# Patient Record
Sex: Female | Born: 1935 | Race: White | Hispanic: No | Marital: Married | Smoking: Former smoker
Health system: Southern US, Community
[De-identification: ages and names within clinical notes are randomized; demographics above are authoritative.]

## PROBLEM LIST (undated history)

## (undated) DIAGNOSIS — I4891 Unspecified atrial fibrillation: Principal | ICD-10-CM

## (undated) DIAGNOSIS — Z8673 Personal history of transient ischemic attack (TIA), and cerebral infarction without residual deficits: Secondary | ICD-10-CM

## (undated) DIAGNOSIS — Q893 Situs inversus: Secondary | ICD-10-CM

## (undated) DIAGNOSIS — I639 Cerebral infarction, unspecified: Secondary | ICD-10-CM

## (undated) DIAGNOSIS — M810 Age-related osteoporosis without current pathological fracture: Secondary | ICD-10-CM

## (undated) DIAGNOSIS — H3530 Unspecified macular degeneration: Secondary | ICD-10-CM

## (undated) DIAGNOSIS — I10 Essential (primary) hypertension: Secondary | ICD-10-CM

## (undated) DIAGNOSIS — R739 Hyperglycemia, unspecified: Secondary | ICD-10-CM

## (undated) DIAGNOSIS — M1990 Unspecified osteoarthritis, unspecified site: Secondary | ICD-10-CM

## (undated) DIAGNOSIS — E559 Vitamin D deficiency, unspecified: Secondary | ICD-10-CM

## (undated) DIAGNOSIS — E785 Hyperlipidemia, unspecified: Secondary | ICD-10-CM

## (undated) DIAGNOSIS — E7800 Pure hypercholesterolemia, unspecified: Secondary | ICD-10-CM

## (undated) HISTORY — DX: Hyperlipidemia, unspecified: E78.5

## (undated) HISTORY — DX: Hyperglycemia, unspecified: R73.9

## (undated) HISTORY — DX: Pure hypercholesterolemia, unspecified: E78.00

## (undated) HISTORY — DX: Unspecified atrial fibrillation: I48.91

## (undated) HISTORY — DX: Essential (primary) hypertension: I10

## (undated) HISTORY — PX: TOTAL ABDOMINAL HYSTERECTOMY: SHX209

## (undated) HISTORY — PX: CATARACT EXTRACTION: SUR2

## (undated) HISTORY — DX: Vitamin D deficiency, unspecified: E55.9

## (undated) HISTORY — PX: CHOLECYSTECTOMY: SHX55

## (undated) HISTORY — PX: BUNIONECTOMY: SHX129

## (undated) HISTORY — DX: Unspecified osteoarthritis, unspecified site: M19.90

## (undated) HISTORY — DX: Unspecified macular degeneration: H35.30

## (undated) HISTORY — DX: Situs inversus: Q89.3

## (undated) HISTORY — DX: Personal history of transient ischemic attack (TIA), and cerebral infarction without residual deficits: Z86.73

## (undated) HISTORY — DX: Cerebral infarction, unspecified: I63.9

## (undated) HISTORY — PX: BILATERAL OOPHORECTOMY: SHX1221

## (undated) HISTORY — DX: Age-related osteoporosis without current pathological fracture: M81.0

---

## 1957-06-14 HISTORY — PX: BREAST EXCISIONAL BIOPSY: SUR124

## 2000-06-24 ENCOUNTER — Encounter: Admission: RE | Admit: 2000-06-24 | Discharge: 2000-06-24 | Payer: Self-pay | Admitting: Geriatric Medicine

## 2000-06-24 ENCOUNTER — Encounter: Payer: Self-pay | Admitting: Geriatric Medicine

## 2000-08-17 ENCOUNTER — Ambulatory Visit (HOSPITAL_COMMUNITY): Admission: RE | Admit: 2000-08-17 | Discharge: 2000-08-17 | Payer: Self-pay | Admitting: Gastroenterology

## 2000-08-17 ENCOUNTER — Encounter: Payer: Self-pay | Admitting: Gastroenterology

## 2001-06-26 ENCOUNTER — Encounter: Payer: Self-pay | Admitting: Geriatric Medicine

## 2001-06-26 ENCOUNTER — Encounter: Admission: RE | Admit: 2001-06-26 | Discharge: 2001-06-26 | Payer: Self-pay | Admitting: Geriatric Medicine

## 2002-08-01 ENCOUNTER — Encounter: Payer: Self-pay | Admitting: Geriatric Medicine

## 2002-08-01 ENCOUNTER — Encounter: Admission: RE | Admit: 2002-08-01 | Discharge: 2002-08-01 | Payer: Self-pay | Admitting: Geriatric Medicine

## 2003-08-06 ENCOUNTER — Encounter: Admission: RE | Admit: 2003-08-06 | Discharge: 2003-08-06 | Payer: Self-pay | Admitting: Geriatric Medicine

## 2004-08-06 ENCOUNTER — Encounter: Admission: RE | Admit: 2004-08-06 | Discharge: 2004-08-06 | Payer: Self-pay | Admitting: Geriatric Medicine

## 2004-09-12 ENCOUNTER — Inpatient Hospital Stay (HOSPITAL_COMMUNITY): Admission: EM | Admit: 2004-09-12 | Discharge: 2004-09-15 | Payer: Self-pay | Admitting: Emergency Medicine

## 2004-09-14 ENCOUNTER — Encounter (INDEPENDENT_AMBULATORY_CARE_PROVIDER_SITE_OTHER): Payer: Self-pay | Admitting: Cardiology

## 2005-08-19 ENCOUNTER — Encounter: Admission: RE | Admit: 2005-08-19 | Discharge: 2005-08-19 | Payer: Self-pay | Admitting: Geriatric Medicine

## 2005-09-07 ENCOUNTER — Encounter: Admission: RE | Admit: 2005-09-07 | Discharge: 2005-09-07 | Payer: Self-pay | Admitting: Geriatric Medicine

## 2005-09-10 ENCOUNTER — Encounter: Admission: RE | Admit: 2005-09-10 | Discharge: 2005-09-10 | Payer: Self-pay | Admitting: Geriatric Medicine

## 2006-03-10 ENCOUNTER — Encounter: Admission: RE | Admit: 2006-03-10 | Discharge: 2006-03-10 | Payer: Self-pay | Admitting: Geriatric Medicine

## 2006-08-24 ENCOUNTER — Encounter: Admission: RE | Admit: 2006-08-24 | Discharge: 2006-08-24 | Payer: Self-pay | Admitting: Geriatric Medicine

## 2007-02-14 ENCOUNTER — Encounter: Admission: RE | Admit: 2007-02-14 | Discharge: 2007-02-14 | Payer: Self-pay | Admitting: Geriatric Medicine

## 2007-08-25 ENCOUNTER — Encounter: Admission: RE | Admit: 2007-08-25 | Discharge: 2007-08-25 | Payer: Self-pay | Admitting: Geriatric Medicine

## 2008-08-28 ENCOUNTER — Encounter: Admission: RE | Admit: 2008-08-28 | Discharge: 2008-08-28 | Payer: Self-pay | Admitting: Geriatric Medicine

## 2009-08-29 ENCOUNTER — Encounter: Admission: RE | Admit: 2009-08-29 | Discharge: 2009-08-29 | Payer: Self-pay | Admitting: Geriatric Medicine

## 2010-07-05 ENCOUNTER — Encounter: Payer: Self-pay | Admitting: Geriatric Medicine

## 2010-07-27 ENCOUNTER — Other Ambulatory Visit: Payer: Self-pay | Admitting: Geriatric Medicine

## 2010-07-27 DIAGNOSIS — Z1231 Encounter for screening mammogram for malignant neoplasm of breast: Secondary | ICD-10-CM

## 2010-08-31 ENCOUNTER — Ambulatory Visit
Admission: RE | Admit: 2010-08-31 | Discharge: 2010-08-31 | Disposition: A | Discharge status: Home / Self Care | Payer: Self-pay | Source: Ambulatory Visit | Attending: Geriatric Medicine | Admitting: Geriatric Medicine

## 2010-08-31 DIAGNOSIS — Z1231 Encounter for screening mammogram for malignant neoplasm of breast: Secondary | ICD-10-CM

## 2010-10-30 NOTE — H&P (Signed)
Jill Harvey, Jill Harvey            ACCOUNT NO.:  [id number]   MEDICAL RECORD NO.:  [id number]          PATIENT TYPE:  INP   LOCATION:  1826                         FACILITY:  [facility]   PHYSICIAN:  Deanna Artis. Hickling, M.D.DATE OF BIRTH:  May 02, 1926   DATE OF ADMISSION:  09/12/2004  DATE OF DISCHARGE:                                HISTORY & PHYSICAL   CHIEF COMPLAINT:  Code stroke.   HISTORY OF PRESENT ILLNESS:  An 77 year old right-handed Caucasian woman  working in the yard with her husband.  About 1 p.m., she began to have  difficulty tying ribbons, was clumsy with planting, started to stumble, and  had slurred speech.  She became somnolent and poorly responsive.  Her  husband called EMS.  They arrived at [facility] at 1501.  Assessed by Dr. Radford Pax,  who called on me at 1535.  CT at 1540, interpreted stat by me.  No acute  infarction, mild atrophy.  The patient improved in the hospital, but when  left alone is stuporous.   PAST MEDICAL HISTORY:  1.  Dyslipidemia.  2.  Hypertension.  3.  Postmenopausal.  4.  Allergic rhinitis.  5.  Macular degeneration.   REVIEW OF SYSTEMS:  Otherwise negative for 12-system review.   PAST SURGICAL HISTORY:  1.  Hysterectomy.  2.  Sinus operation.  3.  Cholecystectomy.  4.  Right iridectomy.   MEDICATIONS:  1.  Atenolol.  2.  Lipitor.  3.  Lasix.  4.  Premarin.  5.  Zyrtec.   ALLERGIES:  None.   FAMILY HISTORY:  Mother with heart disease, died at age 3.  Father died in  mid-60s with diabetes mellitus.  Sisters, age 12 and 69, alive and well.   SOCIAL HISTORY:  The patient is a retired Engineer, civil (consulting), and is married.  No alcohol  or smoking history.  Fully independent.  Rankin score 0.   PHYSICAL EXAMINATION:  VITAL SIGNS:  Blood pressure 191/88, resting pulse  60, respirations 20, temperature 97.9, oxygen saturation 100%.  HEENT:  No bruits, no infection, no meningismus.  LUNGS:  Clear.  HEART:  Regular sinus rhythm.  No murmurs.  Pulses  normal.  ABDOMEN:  Soft.  Bowel sounds normal.  No hepatosplenomegaly.  EXTREMITIES:  Normal.  NEUROLOGIC:  The patient is sleepy.  Has dysarthria.  No dysphagia.  Oriented.  NIH stroke scale score is 3.  Cranial nerves - round and reactive  pupils.  Right iridectomy.  Fundi with possible macular degeneration in the  right eye.  Visual fields full.  Mild left central 7th.  Decreased sensation  on the left side.  Her hearing is okay.  Visual acuity is poor.  Motor  examination revealed no drift.  Fine motor movements were good.  Strength is  normal.  Sensation - left hypesthesia.  Does not respect the midline.  Cerebellar examination - finger-to-nose, no dystaxia, dysmetria.  Gait falls  to the right when she walks.  Deep tendon reflexes were diminished.  The  patient had bilateral flexor plantar responses.   IMPRESSION:  Left central 7th left hemihypesthesia, right hemidystaxia with  gait disorder.  This may represent a high right pontine or mesencephalic  lesion.   PLAN:  IV fluids, aspirin, oxygen, MRI, MRA today so I can determine the  extent of this.  I am supposed to leave her off of heparin unless she  progresses, in which case I would start it.  I am worried about the  possibility of a basilar artery thrombosis.   I appreciate the opportunity to participate in her care.  I have discussed  this thoroughly with the patient and her husband.  She was not a candidate  for TPA because she was beyond the 3 hour window, and, in my opinion, this  represented a small vessel stroke, and therefore catheter angiography was  not indicated.      [facility]/MEDQ  D:  09/12/2004  T:  09/12/2004  Job:  [id number]

## 2010-10-30 NOTE — Discharge Summary (Signed)
Jill Harvey, CHAPLIN            ACCOUNT NO.:  [id number]   MEDICAL RECORD NO.:  [id number]          PATIENT TYPE:  INP   LOCATION:  3027                         FACILITY:  [facility]   PHYSICIAN:  Zetha Kuhar Dictator       DATE OF BIRTH:  17-Jun-1925   DATE OF ADMISSION:  09/12/2004  DATE OF DISCHARGE:  09/15/2004                                 DISCHARGE SUMMARY   DIAGNOSES AT THE TIME OF DISCHARGE:  1.  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  2.  Right internal carotid artery stenosis.  3.  Hypertension.  4.  Hypercholesterolemia.  5.  Postmenopausal.  6.  Allergic rhinitis.  7.  Macular degeneration.  8.  Status post hysterectomy.  9.  Status post cholecystectomy.  10. Status post oophorectomy.  11. Status post sinus operation.   MEDICINES AT THE TIME OF DISCHARGE:  1.  Aggrenox one p.o. daily x14 days and increase to b.i.d.  2.  Lasix 20 mg a day.  3.  Lipitor 10 mg a day.  4.  Vasotec 2.5 mg a day.  5.  Zyrtec 10 mg a day.  6.  Atenolol 50 mg a day.  7.  Stop Premarin.  8.  Stop aspirin.   STUDIES PERFORMED:  1.  CT of the brain on admission shows no acute intracranial findings and      mucus retention cyst or polyp in right maxillary sinus.  2.  MRI of the brain initially showed no acute abnormality with an apparent      occlusion in the left internal carotid artery on the MRA possibly due to      artifact as it is patent on the contrast-enhanced MRA.  There is severe      stenosis of the left PCA.  There is also disease in the right ACA and      right MCA.  3.  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  There is a 60-80% web-like stenosis in the mid cervical      right internal carotid artery. The left internal carotid artery appears      patent with only mild narrowing.  4.   Repeat MRI showed marked interval change with development of a large      right thalamic infarction and a tiny left thalamic infarction.  5.  2-D echocardiogram shows an ejection fraction of 55-65% with no embolic      source.  6.  Transcranial Doppler is pending.  7.  Carotid Doppler is pending.  8.  EKG shows suspect lead reversal interpretation, same side reversal,      unusual P axis, possible atropic atrial rhythm with occasional      supraventricular complexes, left posterior fascicular block, cannot rule      out inferior infarction age undetermined, ST-T wave abnormality and      consider lateral ischemia.  No significant change since last  tracing per      Dr. Elsie Lincoln.   LABORATORY STUDIES:  Chemistry normal, except for glucose 111.  Phenobarbital less than 5.0.  Coagulation studies normal.  CBC normal.  Differential normal.  Activated partial thromboplastin normal.  Hemoglobin  A1C normal.  Cardiac markers with myoglobin 112, CK-MB 3.1 and troponin I of  0.08.  Homocystine 8.32.  Cholesterol 155, triglycerides 100, HDL 55 and LDL  80.  Urinalysis was normal.   HISTORY OF PRESENT ILLNESS:  [name] is an 77 year old, right-  handed, white female who was working in the yard with her husband.  About 1  p.m. the day of admission, she began having difficulty tying ribbons and was  clumsy with planning, started to stumble and had slurred speech.  She began  somnolent and poorly responsive.  Her husband called EMS.  They arrived at  [facility] at 3:01 p.m.  They were assessed by Dr. Radford Pax, who called Dr. Sharene Skeans  at 3:35 p.m.  CT at 3:40 p.m. interpreted by Dr. Sharene Skeans showed no acute  infarction and mild atrophy.  The patient was improving in the hospital, but  when left alone was stuporous.  She was not given TPA secondary to time  constraint and improvement in symptoms.  She was admitted to the hospital  for further workup.   HOSPITAL COURSE:  Initial MRI was negative for  acute infarction, however,  repeat MRI showed right greater than left thalamic infarctions.  MRA was  somewhat puzzling as that precontrast the entire left carotid and MVA were  absent, however, after contrast they were present and patent.  She did have  some posterior circulation disease and we think that is the etiology of her  infarction, as well as small vessel disease.   Risk factors include hypercholesterolemia which is well controlled on  Lipitor and hypertension also controlled prior to admission and good in the  hospital.  No other risk factors identified other than daily Premarin prior  to admission for which the patient was advised to stop.   The patient was placed on Aggrenox for secondary stroke prevention as she  had been on aspirin 81 mg daily at home prior to admission.  She will  maintain blood pressure and cholesterol control.  She has been assessed by  PT and OT and felt safe to return home with her husband at the [facility]  and receive home health physical and occupational therapy.  This will be set  up prior to discharge and the patient will follow up with primary physician  and Dr. Pearlean Brownie.   CONDITION AT DISCHARGE:  The patient is alert and oriented x3.  Speech  clear.  No aphasia.  Her visual fields are full and her extraocular  movements are intact.  Her chest is clear to auscultation.  Her heart rate  is regular.  She has a very, very mild left upper extremity drift.  Strength  is normal at 5/5 bilaterally, but she has decreased fine motor movements and  finger taps in her left hand and her right arm satellites around her left.  She does have slight drift to the left when she walks.   DISCHARGE PLAN:  1.  Discharged home with husband.  2.  Home PT and OT through [facility].  3.  Aggrenox for secondary stroke prevention.  4.  Followup carotid Doppler.  Address any issues that may exist as an      outpatient. 5.  Follow up with Dr. Pete Glatter  in one month.  6.   Follow up with Dr. Pearlean Brownie in two to three months.      SB/MEDQ  D:  09/15/2004  T:  09/15/2004  Job:  [id number]   cc:   Hal T. Stoneking, M.D.  [address] [place], [place] [address]  Fax: [phone]   Pramod P. Pearlean Brownie, MD  Fax: [phone]

## 2010-10-30 NOTE — Procedures (Signed)
[facility]  Patient:    Jill Harvey, Jill Harvey                   MRN: [id number] Proc. Date: 08/17/00 Adm. Date:  [id number] Attending:  Louie Bun CC:         Hal T. Stoneking, M.D.   Procedure Report  INDICATION FOR PROCEDURE:  This was setup as a screening colonoscopy in a 77 year old patient with no previous colon screening.  DESCRIPTION OF PROCEDURE:  The patient was placed in the left lateral decubitus position and placed on the pulse monitor with continuous low-flow oxygen delivered by nasal cannula.  She was sedated with 60 mg IV Demerol and 5 mg IV Versed.  The Olympus video colonoscope was inserted into the rectum and advanced to an approximately 30 cm at which level there was a significant amount of tortuosity and looping, as well as resistance and I could not traverse this area even with the pediatric colonoscope.  This was felt to be due to intraabdominal adhesions from previous surgery.  The scope was withdrawn and the visualized portions of the sigmoid and rectum appeared normal down to the anus, where retroflexed view did reveal some small internal hemorrhoids.  The colonoscope was then withdrawn and the patient returned to the recovery room in stable condition.  She tolerated the procedure well and there were no immediate complications.  IMPRESSION:  Internal hemorrhoids, otherwise normal study to 30 cm, unable to advance further due to angulation and fixation of the colon.  PLAN:  I will obtain a follow-up barium enema to rule out proximal colonic neoplasm. DD:  08/17/00 TD:  08/17/00 Job: [id number]

## 2011-08-04 ENCOUNTER — Other Ambulatory Visit: Payer: Self-pay | Admitting: Geriatric Medicine

## 2011-08-04 DIAGNOSIS — Z1231 Encounter for screening mammogram for malignant neoplasm of breast: Secondary | ICD-10-CM

## 2011-09-02 ENCOUNTER — Ambulatory Visit
Admission: RE | Admit: 2011-09-02 | Discharge: 2011-09-02 | Disposition: A | Discharge status: Home / Self Care | Payer: PRIVATE HEALTH INSURANCE | Source: Ambulatory Visit | Attending: Geriatric Medicine | Admitting: Geriatric Medicine

## 2011-09-02 DIAGNOSIS — Z1231 Encounter for screening mammogram for malignant neoplasm of breast: Secondary | ICD-10-CM

## 2012-09-04 ENCOUNTER — Other Ambulatory Visit: Payer: Self-pay

## 2012-09-04 DIAGNOSIS — Z1231 Encounter for screening mammogram for malignant neoplasm of breast: Secondary | ICD-10-CM

## 2012-09-08 ENCOUNTER — Ambulatory Visit
Admission: RE | Admit: 2012-09-08 | Discharge: 2012-09-08 | Disposition: A | Discharge status: Home / Self Care | Payer: Medicare Other | Source: Ambulatory Visit

## 2012-09-08 DIAGNOSIS — Z1231 Encounter for screening mammogram for malignant neoplasm of breast: Secondary | ICD-10-CM

## 2013-05-13 ENCOUNTER — Encounter: Payer: Self-pay | Admitting: Cardiology

## 2013-05-14 ENCOUNTER — Encounter: Payer: Self-pay | Admitting: Cardiology

## 2013-05-14 ENCOUNTER — Encounter: Payer: Self-pay | Admitting: *Deleted

## 2013-05-14 ENCOUNTER — Ambulatory Visit (INDEPENDENT_AMBULATORY_CARE_PROVIDER_SITE_OTHER): Payer: Medicare Other | Admitting: Cardiology

## 2013-05-14 VITALS — BP 124/82 | HR 102 | Ht 64.0 in | Wt 132.0 lb

## 2013-05-14 DIAGNOSIS — Z8673 Personal history of transient ischemic attack (TIA), and cerebral infarction without residual deficits: Secondary | ICD-10-CM

## 2013-05-14 DIAGNOSIS — E785 Hyperlipidemia, unspecified: Secondary | ICD-10-CM

## 2013-05-14 DIAGNOSIS — Q893 Situs inversus: Secondary | ICD-10-CM

## 2013-05-14 DIAGNOSIS — I10 Essential (primary) hypertension: Secondary | ICD-10-CM

## 2013-05-14 DIAGNOSIS — I4891 Unspecified atrial fibrillation: Secondary | ICD-10-CM

## 2013-05-14 HISTORY — DX: Situs inversus: Q89.3

## 2013-05-14 HISTORY — DX: Essential (primary) hypertension: I10

## 2013-05-14 HISTORY — DX: Personal history of transient ischemic attack (TIA), and cerebral infarction without residual deficits: Z86.73

## 2013-05-14 HISTORY — DX: Hyperlipidemia, unspecified: E78.5

## 2013-05-14 HISTORY — DX: Unspecified atrial fibrillation: I48.91

## 2013-05-14 LAB — BASIC METABOLIC PANEL
BUN: 16 mg/dL (ref 6–23)
CO2: 29 mEq/L (ref 19–32)
Calcium: 9.2 mg/dL (ref 8.4–10.5)
Chloride: 107 mEq/L (ref 96–112)
Glucose, Bld: 98 mg/dL (ref 70–99)
Potassium: 4.5 mEq/L (ref 3.5–5.1)
Sodium: 143 mEq/L (ref 135–145)

## 2013-05-14 LAB — CBC WITH DIFFERENTIAL/PLATELET
Basophils Absolute: 0 10*3/uL (ref 0.0–0.1)
Eosinophils Relative: 1.1 % (ref 0.0–5.0)
HCT: 37 % (ref 36.0–46.0)
Lymphocytes Relative: 32.3 % (ref 12.0–46.0)
Lymphs Abs: 2.1 10*3/uL (ref 0.7–4.0)
Monocytes Relative: 9.2 % (ref 3.0–12.0)
Platelets: 231 10*3/uL (ref 150.0–400.0)
WBC: 6.5 10*3/uL (ref 4.5–10.5)

## 2013-05-14 MED ORDER — DILTIAZEM HCL ER COATED BEADS 120 MG PO CP24: 120.0000 mg | ORAL_CAPSULE | Freq: Every day | ORAL | Status: DC

## 2013-05-14 NOTE — Patient Instructions (Signed)
Your physician recommends that you schedule a follow-up appointment in: POST  CARDIOVERSION  Your physician has recommended you make the following change in your medication:  START  CARDIZEM  CD  120 MG  EVERY DAY  Your physician has recommended that you have a Cardioversion (DCCV). Electrical Cardioversion uses a jolt of electricity to your heart either through paddles or wired patches attached to your chest. This is a controlled, usually prescheduled, procedure. Defibrillation is done under light anesthesia in the hospital, and you usually go home the day of the procedure. This is done to get your heart back into a normal rhythm. You are not awake for the procedure. Please see the instruction sheet given to you today.   Your physician recommends that you return for lab work in: TODAY  BMET CBC WITH DIFF AND  MAG

## 2013-05-14 NOTE — Addendum Note (Signed)
Addended by: Jake Bathe on: 05/14/2013 04:56 PM   Modules accepted: Orders

## 2013-05-14 NOTE — Progress Notes (Signed)
[address] [place], [place]  [address] Phone: [phone] Fax:  [phone]  Date:  05/14/2013   ID:  Jill Harvey, DOB Sep 05, 1925, MRN [id number]  PCP:  Ginette Otto, MD   History of Present Illness: Jill Harvey is a 79 y.o. female here for evaluation of new onset atrial fibrillation/atrial flutter which likely is exacerbating fatigue. Has a history of prior stroke. Dr. Pete Glatter, her primary physician, has discontinued Aggrenox and started Eliquis. Her atenolol also was increased to 50 mg a day to help overall reduce heart rate. Creatinine is 0.95, hemoglobin 12.7, TSH was normal. EKG 05/08/13 showed atrial/flutter, right axis deviation, may be result of situs inversus, nonspecific ST-T wave changes.  In November, patient's daughter noted that she was becoming less able to perform physically she had in the past. More fatigued more quickly with regular activities. She felt lightheaded at times and had symptoms of dizziness but did not have syncope. She does not feel palpitations but she felt nervous in her chest.     Wt Readings from Last 3 Encounters:  05/14/13 132 lb (59.875 kg)     Past Medical History  Diagnosis Date  . Osteoporosis   . Stroke   . Hypertension   . Hypercholesterolemia   . Macular degeneration   . Situs inversus totalis   . Osteoarthritis   . Hyperglycemia   . Vitamin D deficiency   . A-fib 05/14/2013  . HTN (hypertension) 05/14/2013  . Hyperlipemia 05/14/2013  . History of stroke 05/14/2013  . 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 Total abdominal hysterectomy    . Bilateral oophorectomy    . Cholecystectomy    . Cataract extraction Bilateral   . Bunionectomy Left     Current Outpatient Prescriptions  Medication Sig Dispense Refill  . alendronate (FOSAMAX) 70 MG tablet Take 70 mg by mouth once a week. Take with a full glass of water on an empty stomach.      Marland Kitchen  apixaban (ELIQUIS) 2.5 MG TABS tablet Take 2.5 mg by mouth 2 (two) times daily.      Marland Kitchen atenolol (TENORMIN) 50 MG tablet Take 50 mg by mouth daily.      Marland Kitchen atorvastatin (LIPITOR) 10 MG tablet Take 10 mg by mouth daily.      . beta carotene w/minerals (OCUVITE) tablet Take 1 tablet by mouth daily.      . Calcium Carbonate-Vitamin D 600-400 MG-UNIT per tablet Take 1 tablet by mouth daily.      . cetirizine (ZYRTEC) 10 MG tablet Take 10 mg by mouth daily.      . ergocalciferol (VITAMIN D2) 50000 UNITS capsule Take 50,000 Units by mouth once a week.      . furosemide (LASIX) 20 MG tablet Take 20 mg by mouth daily.      . Multiple Vitamin (MULTI VITAMIN DAILY PO) Take 1 tablet by mouth daily.      . Omega-3 Fatty Acids (FISH OIL) 1000 MG CAPS Take 1 capsule by mouth daily.       No current facility-administered medications for this visit.    Allergies:    Allergies  Allergen Reactions  . Amoxicillin   . Macrodantin [Nitrofurantoin Macrocrystal]     Social History:  The patient  reports that she has quit smoking. She does not have any smokeless tobacco history on file. She reports that she does not drink alcohol  or use illicit drugs.   No family history on file.  ROS:  Please see the history of present illness.   Denies any fevers, chills, orthopnea, PND, syncope, chest pain. Recent weakness/fatigue. No bleeding.   All other systems reviewed and negative.   PHYSICAL EXAM: VS:  BP 124/82  Pulse 102  Ht 5\' 4"  (1.626 m)  Wt 132 lb (59.875 kg)  BMI 22.65 kg/m2  SpO2 98% Well nourished, well developed, in no acute distressElderly HEENT: normal, [place]/AT, EOMI Neck: no JVD, normal carotid upstroke, no bruit Cardiac: Irregularly irregular, mildly tachycardic; no murmur Lungs:  clear to auscultation bilaterally, no wheezing, rhonchi or rales Abd: soft, nontender, no hepatomegaly, no bruits Ext: no edema, 2+ distal pulses Skin: warm and dry GU: deferred Neuro: no focal abnormalities noted, AAO  x 3  EKG:  05/08/13-1/2 atrial fibrillation/flutter heart rate 144 beats per minute with nonspecific ST-T wave changes. Right axis deviation noted. Lab work as above. Prior medical records reviewed.  ASSESSMENT AND PLAN:  1. Atrial fibrillation-new onset. We've discussed the implications, treatment strategies both rhythm/rate control. She still remains slightly tachycardic. With the increase in atenolol she does feel somewhat better. I will start diltiazem CD 120 mg once a day to further improve rate control. Continue with anticoagulation as already prescribed. I expressed the importance of anticoagulation in both rate or rhythm control. We have also decided to pursue cardioversion since this is her first episode of atrial fibrillation and likely symptomatic. She began anticoagulation on November 10. I would like to pursue cardioversion around December 10. This will give her a full 4 weeks of anticoagulation. The risks and benefits of procedure including stroke, arrhythmia, anesthesia complications have been discussed. Her daughter, NP at [facility] in neurology was present for discussion. We discussed alternative options of continuing with rate control as well. Questions were answered about pacemaker/ablation as well. 2. Hypertension-currently well controlled. We will be careful given increase in atenolol recently as well as new start diltiazem. I've suggested that she hold her furosemide. 3. Situs inversus-dextrocardia noted. Checking echocardiogram. 4. Hyperlipidemia-atorvastatin.  Signed, Donato Schultz, MD [facility]  05/14/2013 1:23 PM

## 2013-05-15 ENCOUNTER — Other Ambulatory Visit: Payer: Self-pay

## 2013-05-15 ENCOUNTER — Ambulatory Visit (HOSPITAL_COMMUNITY): Payer: Medicare Other | Attending: Cardiology | Admitting: Radiology

## 2013-05-15 DIAGNOSIS — I379 Nonrheumatic pulmonary valve disorder, unspecified: Secondary | ICD-10-CM | POA: Insufficient documentation

## 2013-05-15 DIAGNOSIS — Z8673 Personal history of transient ischemic attack (TIA), and cerebral infarction without residual deficits: Secondary | ICD-10-CM | POA: Insufficient documentation

## 2013-05-15 DIAGNOSIS — I079 Rheumatic tricuspid valve disease, unspecified: Secondary | ICD-10-CM | POA: Insufficient documentation

## 2013-05-15 DIAGNOSIS — I4891 Unspecified atrial fibrillation: Secondary | ICD-10-CM | POA: Insufficient documentation

## 2013-05-15 DIAGNOSIS — I059 Rheumatic mitral valve disease, unspecified: Secondary | ICD-10-CM | POA: Insufficient documentation

## 2013-05-15 DIAGNOSIS — Q893 Situs inversus: Secondary | ICD-10-CM

## 2013-05-15 NOTE — Progress Notes (Signed)
Echocardiogram performed.  

## 2013-05-17 ENCOUNTER — Telehealth: Payer: Self-pay | Admitting: Cardiology

## 2013-05-17 NOTE — Telephone Encounter (Signed)
New problem:  Pt is calling in for recent test results.

## 2013-05-17 NOTE — Telephone Encounter (Signed)
Lab results sent to PCP

## 2013-05-25 ENCOUNTER — Encounter (HOSPITAL_COMMUNITY): Payer: Self-pay | Admitting: Pharmacy Technician

## 2013-05-28 ENCOUNTER — Encounter (HOSPITAL_COMMUNITY): Payer: Medicare Other | Admitting: Anesthesiology

## 2013-05-28 ENCOUNTER — Ambulatory Visit (HOSPITAL_COMMUNITY)
Admission: RE | Admit: 2013-05-28 | Discharge: 2013-05-28 | Disposition: A | Discharge status: Home / Self Care | Payer: Medicare Other | Source: Ambulatory Visit | Attending: Cardiology | Admitting: Cardiology

## 2013-05-28 ENCOUNTER — Ambulatory Visit (HOSPITAL_COMMUNITY): Payer: Medicare Other | Admitting: Anesthesiology

## 2013-05-28 ENCOUNTER — Encounter (HOSPITAL_COMMUNITY): Payer: Self-pay | Admitting: Anesthesiology

## 2013-05-28 ENCOUNTER — Encounter (HOSPITAL_COMMUNITY)
Admission: RE | Disposition: A | Discharge status: Home / Self Care | Payer: Self-pay | Source: Ambulatory Visit | Attending: Cardiology

## 2013-05-28 DIAGNOSIS — Z7901 Long term (current) use of anticoagulants: Secondary | ICD-10-CM | POA: Insufficient documentation

## 2013-05-28 DIAGNOSIS — E7800 Pure hypercholesterolemia, unspecified: Secondary | ICD-10-CM | POA: Insufficient documentation

## 2013-05-28 DIAGNOSIS — Z8673 Personal history of transient ischemic attack (TIA), and cerebral infarction without residual deficits: Secondary | ICD-10-CM | POA: Insufficient documentation

## 2013-05-28 DIAGNOSIS — E785 Hyperlipidemia, unspecified: Secondary | ICD-10-CM | POA: Insufficient documentation

## 2013-05-28 DIAGNOSIS — I10 Essential (primary) hypertension: Secondary | ICD-10-CM | POA: Insufficient documentation

## 2013-05-28 DIAGNOSIS — Q248 Other specified congenital malformations of heart: Secondary | ICD-10-CM | POA: Insufficient documentation

## 2013-05-28 DIAGNOSIS — I4891 Unspecified atrial fibrillation: Secondary | ICD-10-CM | POA: Diagnosis present

## 2013-05-28 HISTORY — PX: CARDIOVERSION: SHX1299

## 2013-05-28 SURGERY — CARDIOVERSION
Anesthesia: Monitor Anesthesia Care

## 2013-05-28 MED ORDER — ATENOLOL 25 MG PO TABS: 25.0000 mg | ORAL_TABLET | Freq: Every day | ORAL | Status: DC

## 2013-05-28 MED ORDER — PROPOFOL 10 MG/ML IV BOLUS
INTRAVENOUS | Status: DC | PRN
  Administered 2013-05-28: 90 mg via INTRAVENOUS

## 2013-05-28 NOTE — Anesthesia Postprocedure Evaluation (Signed)
Anesthesia Post Note  Patient: Jill Harvey  Procedure(s) Performed: Procedure(s) (LRB): CARDIOVERSION (N/A)  Anesthesia type: General  Patient location: PACU  Post pain: Pain level controlled  Post assessment: Patient's Cardiovascular Status Stable  Last Vitals:  Filed Vitals:   05/28/13 1254  BP: 125/84  Pulse: 50  Temp:   Resp: 20    Post vital signs: Reviewed and stable  Level of consciousness: alert  Complications: No apparent anesthesia complications

## 2013-05-28 NOTE — H&P (View-Only) (Signed)
    [address] [place],   [address] Phone: [phone] Fax:  [phone]  Date:  05/14/2013   ID:  Jill Harvey, DOB 09/21/1925, MRN [id number]  PCP:  STONEKING,HAL THOMAS, MD   History of Present Illness: Jill Harvey is a 79 y.o. female here for evaluation of new onset atrial fibrillation/atrial flutter which likely is exacerbating fatigue. Has a history of prior stroke. Dr. Stoneking, her primary physician, has discontinued Aggrenox and started Eliquis. Her atenolol also was increased to 50 mg a day to help overall reduce heart rate. Creatinine is 0.95, hemoglobin 12.7, TSH was normal. EKG 05/08/13 showed atrial/flutter, right axis deviation, may be result of situs inversus, nonspecific ST-T wave changes.  In November, patient's daughter noted that she was becoming less able to perform physically she had in the past. More fatigued more quickly with regular activities. She felt lightheaded at times and had symptoms of dizziness but did not have syncope. She does not feel palpitations but she felt nervous in her chest.     Wt Readings from Last 3 Encounters:  05/14/13 132 lb (59.875 kg)     Past Medical History  Diagnosis Date  . Osteoporosis   . Stroke   . Hypertension   . Hypercholesterolemia   . Macular degeneration   . Situs inversus totalis   . Osteoarthritis   . Hyperglycemia   . Vitamin D deficiency   . A-fib 05/14/2013  . HTN (hypertension) 05/14/2013  . Hyperlipemia 05/14/2013  . History of stroke 05/14/2013  . 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 Total abdominal hysterectomy    . Bilateral oophorectomy    . Cholecystectomy    . Cataract extraction Bilateral   . Bunionectomy Left     Current Outpatient Prescriptions  Medication Sig Dispense Refill  . alendronate (FOSAMAX) 70 MG tablet Take 70 mg by mouth once a week. Take with a full glass of water on an empty stomach.      .  apixaban (ELIQUIS) 2.5 MG TABS tablet Take 2.5 mg by mouth 2 (two) times daily.      . atenolol (TENORMIN) 50 MG tablet Take 50 mg by mouth daily.      . atorvastatin (LIPITOR) 10 MG tablet Take 10 mg by mouth daily.      . beta carotene w/minerals (OCUVITE) tablet Take 1 tablet by mouth daily.      . Calcium Carbonate-Vitamin D 600-400 MG-UNIT per tablet Take 1 tablet by mouth daily.      . cetirizine (ZYRTEC) 10 MG tablet Take 10 mg by mouth daily.      . ergocalciferol (VITAMIN D2) 50000 UNITS capsule Take 50,000 Units by mouth once a week.      . furosemide (LASIX) 20 MG tablet Take 20 mg by mouth daily.      . Multiple Vitamin (MULTI VITAMIN DAILY PO) Take 1 tablet by mouth daily.      . Omega-3 Fatty Acids (FISH OIL) 1000 MG CAPS Take 1 capsule by mouth daily.       No current facility-administered medications for this visit.    Allergies:    Allergies  Allergen Reactions  . Amoxicillin   . Macrodantin [Nitrofurantoin Macrocrystal]     Social History:  The patient  reports that she has quit smoking. She does not have any smokeless tobacco history on file. She reports that she does not drink alcohol   or use illicit drugs.   No family history on file.  ROS:  Please see the history of present illness.   Denies any fevers, chills, orthopnea, PND, syncope, chest pain. Recent weakness/fatigue. No bleeding.   All other systems reviewed and negative.   PHYSICAL EXAM: VS:  BP 124/82  Pulse 102  Ht 5' 4" (1.626 m)  Wt 132 lb (59.875 kg)  BMI 22.65 kg/m2  SpO2 98% Well nourished, well developed, in no acute distressElderly HEENT: normal, [place]/AT, EOMI Neck: no JVD, normal carotid upstroke, no bruit Cardiac: Irregularly irregular, mildly tachycardic; no murmur Lungs:  clear to auscultation bilaterally, no wheezing, rhonchi or rales Abd: soft, nontender, no hepatomegaly, no bruits Ext: no edema, 2+ distal pulses Skin: warm and dry GU: deferred Neuro: no focal abnormalities noted, AAO  x 3  EKG:  05/08/13-1/2 atrial fibrillation/flutter heart rate 144 beats per minute with nonspecific ST-T wave changes. Right axis deviation noted. Lab work as above. Prior medical records reviewed.  ASSESSMENT AND PLAN:  1. Atrial fibrillation-new onset. We've discussed the implications, treatment strategies both rhythm/rate control. She still remains slightly tachycardic. With the increase in atenolol she does feel somewhat better. I will start diltiazem CD 120 mg once a day to further improve rate control. Continue with anticoagulation as already prescribed. I expressed the importance of anticoagulation in both rate or rhythm control. We have also decided to pursue cardioversion since this is her first episode of atrial fibrillation and likely symptomatic. She began anticoagulation on November 10. I would like to pursue cardioversion around December 10. This will give her a full 4 weeks of anticoagulation. The risks and benefits of procedure including stroke, arrhythmia, anesthesia complications have been discussed. Her daughter, NP at [facility] in neurology was present for discussion. We discussed alternative options of continuing with rate control as well. Questions were answered about pacemaker/ablation as well. 2. Hypertension-currently well controlled. We will be careful given increase in atenolol recently as well as new start diltiazem. I've suggested that she hold her furosemide. 3. Situs inversus-dextrocardia noted. Checking echocardiogram. 4. Hyperlipidemia-atorvastatin.  Signed, Mark Skains, MD FACC  05/14/2013 1:23 PM     

## 2013-05-28 NOTE — Interval H&P Note (Signed)
History and Physical Interval Note:  05/28/2013 6:43 AM  Jill Harvey  has presented today for surgery, with the diagnosis of A FIB  The various methods of treatment have been discussed with the patient and family. After consideration of risks, benefits and other options for treatment, the patient has consented to  Procedure(s): CARDIOVERSION (N/A) as a surgical intervention .  The patient's history has been reviewed, patient examined, no change in status, stable for surgery.  I have reviewed the patient's chart and labs.  Questions were answered to the patient's satisfaction.     Summer Parthasarathy

## 2013-05-28 NOTE — Anesthesia Preprocedure Evaluation (Signed)
Anesthesia Evaluation  Patient identified by MRN, date of birth, ID band Patient awake    Reviewed: Allergy & Precautions, H&P , NPO status , Patient's Chart, lab work & pertinent test results, reviewed documented beta blocker date and time   Airway Mallampati: II TM Distance: >3 FB Neck ROM: full    Dental   Pulmonary former smoker,  breath sounds clear to auscultation        Cardiovascular hypertension, + dysrhythmias Atrial Fibrillation Rhythm:regular     Neuro/Psych CVA negative psych ROS   GI/Hepatic negative GI ROS, Neg liver ROS,   Endo/Other  negative endocrine ROS  Renal/GU negative Renal ROS  negative genitourinary   Musculoskeletal   Abdominal   Peds  Hematology negative hematology ROS (+)   Anesthesia Other Findings See surgeon's H&P   Reproductive/Obstetrics negative OB ROS                           Anesthesia Physical Anesthesia Plan  ASA: III  Anesthesia Plan: General   Post-op Pain Management:    Induction: Intravenous  Airway Management Planned: Mask  Additional Equipment:   Intra-op Plan:   Post-operative Plan:   Informed Consent: I have reviewed the patients History and Physical, chart, labs and discussed the procedure including the risks, benefits and alternatives for the proposed anesthesia with the patient or authorized representative who has indicated his/her understanding and acceptance.   Dental Advisory Given  Plan Discussed with: CRNA and Surgeon  Anesthesia Plan Comments:         Anesthesia Quick Evaluation

## 2013-05-28 NOTE — CV Procedure (Signed)
    Electrical Cardioversion Procedure Note Jill Harvey [id number] July 25, 1925  Procedure: Electrical Cardioversion Indications:  Atrial Fibrillation  Time Out: Verified patient identification, verified procedure,medications/allergies/relevent history reviewed, required imaging and test results available.  Performed  Procedure Details  The patient was NPO after midnight. Anesthesia was administered at the beside  by Dr.Fredricks with 80mg  of propofol.  Cardioversion was performed with synchronized biphasic defibrillation via AP pads with 120 joules.  1 attempt(s) were performed.  The patient converted to normal sinus rhythm. The patient tolerated the procedure well   IMPRESSION:  Successful cardioversion of atrial fibrillation. Sinus rhythm with bradycardia upper 40-50. PAC's noted.   Will stop Diltiazem CD 120mg  and decreased atenolol back to 25mg  from 50mg .   [name] 05/28/2013, 12:15 PM

## 2013-05-28 NOTE — Transfer of Care (Signed)
Immediate Anesthesia Transfer of Care Note  Patient: Jill Harvey  Procedure(s) Performed: Procedure(s): CARDIOVERSION (N/A)  Patient Location: Endoscopy Unit  Anesthesia Type:MAC  Level of Consciousness: awake, alert  and oriented  Airway & Oxygen Therapy: Patient Spontanous Breathing and Patient connected to nasal cannula oxygen  Post-op Assessment: Post -op Vital signs reviewed and stable  Post vital signs: Reviewed and stable  Complications: No apparent anesthesia complications

## 2013-05-29 ENCOUNTER — Encounter (HOSPITAL_COMMUNITY): Payer: Self-pay | Admitting: Cardiology

## 2013-06-08 ENCOUNTER — Ambulatory Visit (INDEPENDENT_AMBULATORY_CARE_PROVIDER_SITE_OTHER): Payer: Medicare Other | Admitting: Cardiology

## 2013-06-08 ENCOUNTER — Encounter: Payer: Self-pay | Admitting: Cardiology

## 2013-06-08 VITALS — BP 140/80 | HR 139 | Ht 64.0 in | Wt 134.0 lb

## 2013-06-08 DIAGNOSIS — Z7901 Long term (current) use of anticoagulants: Secondary | ICD-10-CM

## 2013-06-08 DIAGNOSIS — I10 Essential (primary) hypertension: Secondary | ICD-10-CM

## 2013-06-08 DIAGNOSIS — Q248 Other specified congenital malformations of heart: Secondary | ICD-10-CM

## 2013-06-08 DIAGNOSIS — I4891 Unspecified atrial fibrillation: Secondary | ICD-10-CM

## 2013-06-08 DIAGNOSIS — Q240 Dextrocardia: Secondary | ICD-10-CM | POA: Insufficient documentation

## 2013-06-08 MED ORDER — DILTIAZEM HCL ER COATED BEADS 120 MG PO CP24: 120.0000 mg | ORAL_CAPSULE | Freq: Every day | ORAL | Status: DC

## 2013-06-08 MED ORDER — ATENOLOL 50 MG PO TABS: 50.0000 mg | ORAL_TABLET | Freq: Every day | ORAL | Status: DC

## 2013-06-08 NOTE — Progress Notes (Signed)
[address] [place], [place]  [address] Phone: [phone] Fax:  [phone]  Date:  06/08/2013   ID:  Jill Harvey, DOB 08/04/1925, MRN [id number]  PCP:  Ginette Otto, MD   History of Present Illness: Jill Harvey is a 79 y.o. female with dextrocardia here for followup of new onset atrial fibrillation/atrial flutter which likely is exacerbating fatigue. Has a history of prior stroke. Dr. Pete Glatter, her primary physician, has discontinued Aggrenox and started Eliquis. I successfully cardioverted her on 05/28/13 to give her an opportunity of sinus rhythm however on her followup visit on 06/08/13, she was back in atrial fibrillation with rapid ventricular response. Honestly, she still does not feel any different pre-or post cardioversion. We will continue with rate control strategy. Following cardioversion, her heart rate was quite slow in the upper 40s, low 50s so I decreased medication. On 06/08/13 I decided to place her back on her diltiazem and increase her atenolol back to 50 mg as Dr. Pete Glatter had done previously.  Creatinine is 0.95, hemoglobin 12.7, TSH was normal. EKG 05/08/13 showed atrial/flutter, right axis deviation, may be result of situs inversus, nonspecific ST-T wave changes.  In November, patient's daughter noted that she was becoming less able to perform physically she had in the past. More fatigued more quickly with regular activities. She felt lightheaded at times and had symptoms of dizziness but did not have syncope. She does not feel palpitations but she felt nervous in her chest.     Wt Readings from Last 3 Encounters:  06/08/13 134 lb (60.782 kg)  05/14/13 132 lb (59.875 kg)     Past Medical History  Diagnosis Date  . Osteoporosis   . Stroke   . Hypertension   . Hypercholesterolemia   . Macular degeneration   . Situs inversus totalis   . Osteoarthritis   . Hyperglycemia   . Vitamin D deficiency   . A-fib 05/14/2013   . HTN (hypertension) 05/14/2013  . Hyperlipemia 05/14/2013  . History of stroke 05/14/2013  . 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 Total abdominal hysterectomy    . Bilateral oophorectomy    . Cholecystectomy    . Cataract extraction Bilateral   . Bunionectomy Left   . Cardioversion N/A 05/28/2013    Procedure: CARDIOVERSION;  Surgeon: Donato Schultz, MD;  Location: [facility] ENDOSCOPY;  Service: Cardiovascular;  Laterality: N/A;    Current Outpatient Prescriptions  Medication Sig Dispense Refill  . alendronate (FOSAMAX) 70 MG tablet Take 70 mg by mouth once a week. Take with a full glass of water on an empty stomach.      Marland Kitchen apixaban (ELIQUIS) 2.5 MG TABS tablet Take 2.5 mg by mouth 2 (two) times daily.      Marland Kitchen atenolol (TENORMIN) 25 MG tablet Take 1 tablet (25 mg total) by mouth daily.  30 tablet  12  . atorvastatin (LIPITOR) 10 MG tablet Take 10 mg by mouth daily.      . beta carotene w/minerals (OCUVITE) tablet Take 1 tablet by mouth daily.      . Calcium Carbonate-Vitamin D 600-400 MG-UNIT per tablet Take 1 tablet by mouth daily.      . cetirizine (ZYRTEC) 10 MG tablet Take 10 mg by mouth daily.      . ergocalciferol (VITAMIN D2) 50000 UNITS capsule Take 50,000 Units by mouth once a week.      . Multiple  Vitamin (MULTI VITAMIN DAILY PO) Take 1 tablet by mouth daily.      . Omega-3 Fatty Acids (FISH OIL) 1000 MG CAPS Take 1 capsule by mouth daily.       No current facility-administered medications for this visit.    Allergies:    Allergies  Allergen Reactions  . Amoxicillin   . Macrodantin [Nitrofurantoin Macrocrystal]     Social History:  The patient  reports that she has quit smoking. She does not have any smokeless tobacco history on file. She reports that she does not drink alcohol or use illicit drugs.   No family history on file.  ROS:  Please see the history of present illness.   Denies any fevers, chills, orthopnea, PND, syncope,  chest pain. Recent weakness/fatigue. No bleeding.   All other systems reviewed and negative.   PHYSICAL EXAM: VS:  BP 140/80  Pulse 139  Ht 5\' 4"  (1.626 m)  Wt 134 lb (60.782 kg)  BMI 22.99 kg/m2 Well nourished, well developed, in no acute distressElderly HEENT: normal, [place]/AT, EOMI Neck: no JVD, normal carotid upstroke, no bruit Cardiac: Irregularly irregular, mildly tachycardic; no murmur Lungs:  Cle/ar to auscultation bilaterally, no wheezing, rhonchi or rales Abd: soft, nontender, no hepatomegaly, no bruits Ext: no edema, 2+ distal pulses Skin: warm and dry GU: deferred Neuro: no focal abnormalities noted, AAO x 3  EKG:  05/08/13-1/2 atrial fibrillation/flutter heart rate 144 beats per minute with nonspecific ST-T wave changes. Right axis deviation noted. 06/08/13-atrial fibrillation rate 139, dextrocardia. Lab work as above. Prior medical records reviewed.  ASSESSMENT AND PLAN:  1. Atrial fibrillation-new onset. We performed a cardioversion which was successful on 05/28/13 however on 06/08/2013, she is once again in atrial fibrillation/flutter at 139 beats per minute. We've discussed the implications, treatment strategies both rhythm/rate control.   I will restart diltiazem CD 120 mg once a day to further improve rate control as well as increase her atenolol once again to 50 mg. Continue with anticoagulation as already prescribed. I expressed the importance of anticoagulation in both rate or rhythm control.  She began anticoagulation on November 10. Cardioversion did not hold. Her daughter, NP at [facility] in neurology was present for discussion.  Questions were answered about pacemaker/ablation as well. 2. Hypertension-currently well controlled. We will be careful given increase in atenolol recently as well as new start diltiazem. I've suggested that she hold her furosemide. 3. Situs inversus-dextrocardia noted. Checking echocardiogram. 4. Hyperlipidemia-atorvastatin. 5. We will see back  in one month.  Signed, Donato Schultz, MD [facility]  06/08/2013 10:26 AM

## 2013-06-08 NOTE — Patient Instructions (Signed)
Your physician has recommended you make the following change in your medication:  1. Start Diltiazem CD 120 MG 1 tablet Daily 2. Increase Atenolol to 50 MG 1 tablet Daily These Rx's have been sent to [facility] and should be ready to pick up later today.  Your physician recommends that you schedule a follow-up appointment in: One Month with Dr Anne Fu.

## 2013-07-13 ENCOUNTER — Encounter (INDEPENDENT_AMBULATORY_CARE_PROVIDER_SITE_OTHER): Payer: Self-pay

## 2013-07-13 ENCOUNTER — Ambulatory Visit (INDEPENDENT_AMBULATORY_CARE_PROVIDER_SITE_OTHER): Payer: Medicare Other | Admitting: Cardiology

## 2013-07-13 ENCOUNTER — Encounter: Payer: Self-pay | Admitting: Cardiology

## 2013-07-13 VITALS — BP 118/68 | HR 74 | Ht 64.0 in | Wt 135.8 lb

## 2013-07-13 DIAGNOSIS — I4891 Unspecified atrial fibrillation: Secondary | ICD-10-CM

## 2013-07-13 DIAGNOSIS — I10 Essential (primary) hypertension: Secondary | ICD-10-CM

## 2013-07-13 NOTE — Progress Notes (Signed)
[address] [place], [place]  [address] Phone: [phone] Fax:  [phone]  Date:  07/13/2013   ID:  Jill Harvey, DOB Nov 12, 1925, MRN [id number]  PCP:  Ginette Otto, MD   History of Present Illness: Jill Harvey is a 80 y.o. female with dextrocardia here for followup of new onset atrial fibrillation/atrial flutter which likely is exacerbating fatigue. Has a history of prior stroke. Dr. Pete Glatter, her primary physician, has discontinued Aggrenox and started Eliquis. I successfully cardioverted her on 05/28/13 to give her an opportunity of sinus rhythm however on her followup visit on 06/08/13, she was back in atrial fibrillation with rapid ventricular response. Honestly, she still does not feel any different pre-or post cardioversion. We will continue with rate control strategy. Following cardioversion, her heart rate was quite slow in the upper 40s, low 50s so I decreased medication. On 06/08/13 I decided to place her back on her diltiazem and increase her atenolol back to 50 mg as Dr. Pete Glatter had done previously.  Creatinine is 0.95, hemoglobin 12.7, TSH was normal. EKG 05/08/13 showed atrial/flutter, right axis deviation, may be result of situs inversus, nonspecific ST-T wave changes.  In November 2014, patient's daughter noted that she was becoming less able to perform physically she had in the past. More fatigued more quickly with regular activities. She felt lightheaded at times and had symptoms of dizziness but did not have syncope. She does not feel palpitations but she felt nervous in her chest.    07/13/13-overall doing very well, not feeling her atrial fibrillation, no syncope, no orthopnea, no shortness of breath, no chest pain. Tolerating her diltiazem and atenolol well. Her husband is a patient of Dr. Eldridge Dace.   Wt Readings from Last 3 Encounters:  07/13/13 135 lb 12.8 oz (61.598 kg)  06/08/13 134 lb (60.782 kg)    05/14/13 132 lb (59.875 kg)     Past Medical History  Diagnosis Date  . Osteoporosis   . Stroke   . Hypertension   . Hypercholesterolemia   . Macular degeneration   . Situs inversus totalis   . Osteoarthritis   . Hyperglycemia   . Vitamin D deficiency   . A-fib 05/14/2013  . HTN (hypertension) 05/14/2013  . Hyperlipemia 05/14/2013  . History of stroke 05/14/2013  . 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 Total abdominal hysterectomy    . Bilateral oophorectomy    . Cholecystectomy    . Cataract extraction Bilateral   . Bunionectomy Left   . Cardioversion N/A 05/28/2013    Procedure: CARDIOVERSION;  Surgeon: Donato Schultz, MD;  Location: [facility] ENDOSCOPY;  Service: Cardiovascular;  Laterality: N/A;    Current Outpatient Prescriptions  Medication Sig Dispense Refill  . alendronate (FOSAMAX) 70 MG tablet Take 70 mg by mouth once a week. Take with a full glass of water on an empty stomach.      Marland Kitchen apixaban (ELIQUIS) 2.5 MG TABS tablet Take 2.5 mg by mouth 2 (two) times daily.      Marland Kitchen atenolol (TENORMIN) 50 MG tablet Take 1 tablet (50 mg total) by mouth daily.  30 tablet  12  . atorvastatin (LIPITOR) 10 MG tablet Take 10 mg by mouth daily.      . beta carotene w/minerals (OCUVITE) tablet Take 1 tablet by mouth daily.      . Calcium Carbonate-Vitamin D 600-400 MG-UNIT per tablet Take 1 tablet by  mouth daily.      . cetirizine (ZYRTEC) 10 MG tablet Take 10 mg by mouth daily.      Marland Kitchen diltiazem (CARDIZEM CD) 120 MG 24 hr capsule Take 1 capsule (120 mg total) by mouth daily.  30 capsule  12  . ergocalciferol (VITAMIN D2) 50000 UNITS capsule Take 50,000 Units by mouth once a week.      . Multiple Vitamin (MULTI VITAMIN DAILY PO) Take 1 tablet by mouth daily.      . Omega-3 Fatty Acids (FISH OIL) 1000 MG CAPS Take 1 capsule by mouth daily.       No current facility-administered medications for this visit.    Allergies:    Allergies  Allergen  Reactions  . Amoxicillin   . Macrodantin [Nitrofurantoin Macrocrystal]     Social History:  The patient  reports that she has quit smoking. She does not have any smokeless tobacco history on file. She reports that she does not drink alcohol or use illicit drugs.   Family History  Problem Relation Age of Onset  . Heart disease Father     ROS:  Please see the history of present illness.   Denies any fevers, chills, orthopnea, PND, syncope, chest pain. Recent weakness/fatigue. No bleeding.   All other systems reviewed and negative.   PHYSICAL EXAM: VS:  BP 118/68  Pulse 74  Ht 5\' 4"  (1.626 m)  Wt 135 lb 12.8 oz (61.598 kg)  BMI 23.30 kg/m2  SpO2 99% Well nourished, well developed, in no acute distressElderly HEENT: normal, [place]/AT, EOMI Neck: no JVD, normal carotid upstroke, no bruit Cardiac: Irregularly irregular, mildly tachycardic; no murmur Lungs:  Cle/ar to auscultation bilaterally, no wheezing, rhonchi or rales Abd: soft, nontender, no hepatomegaly, no bruits Ext: no edema, 2+ distal pulses Skin: warm and dry GU: deferred Neuro: no focal abnormalities noted, AAO x 3  EKG:  05/08/13-1/2 atrial fibrillation/flutter heart rate 144 beats per minute with nonspecific ST-T wave changes. Right axis deviation noted. 06/08/13-atrial fibrillation rate 139, dextrocardia. Lab work as above. Prior medical records reviewed.  Echocardiogram: 05/15/13: - Left ventricle: The cavity size was normal. There was mild focal basal hypertrophy of the septum. Systolic function was mildly reduced. The estimated ejection fraction was in the range of 45% to 50%. Wall motion was normal; there were no regional wall motion abnormalities. - Mitral valve: Calcified annulus. Mild regurgitation. - Right atrium: The atrium was mildly dilated. - Tricuspid valve: Mild-moderate regurgitation. - Pulmonary arteries: PA peak pressure: 40mm Hg (S). - Line: A venous catheter was visualized in the superior vena  cava, with its tip in the right atrium. No abnormal features noted. Impressions:  - The right ventricular systolic pressure was increased consistent with mild pulmonary hypertension.   ASSESSMENT AND PLAN:  1. Atrial fibrillation-new onset. We performed a cardioversion which was successful on 05/28/13 however on 06/08/2013, she was once again in atrial fibrillation/flutter at 139 beats per minute. We've discussed the implications, treatment strategies both rhythm/rate control.   I restarted diltiazem CD 120 mg once a day to further improve rate control as well as increase her atenolol once again to 50 mg. Continue with anticoagulation as already prescribed. I expressed the importance of anticoagulation in both rate or rhythm control.  She began anticoagulation on April 23, 2013. Cardioversion did not hold. Her daughter, NP at [facility] in neurology was present for discussion.  Questions were answered about pacemaker/ablation as well. 2. Hypertension-currently well controlled. We will be careful given increase  in atenolol recently as well as new start diltiazem. I've suggested that she hold her furosemide. 3. 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 Hyperlipidemia-atorvastatin. 5. We will see back in 4 months.  Signed, Donato SchultzMark Brynlea Spindler, MD [facility]  07/13/2013 9:27 AM

## 2013-07-13 NOTE — Patient Instructions (Signed)
Your physician recommends that you continue on your current medications as directed. Please refer to the Current Medication list given to you today.  Your physician wants you to follow-up in:  4 months with Dr. Skains. You will receive a reminder letter in the mail two months in advance. If you don't receive a letter, please call our office to schedule the follow-up appointment.  

## 2013-08-29 ENCOUNTER — Other Ambulatory Visit: Payer: Self-pay

## 2013-08-29 DIAGNOSIS — Z1231 Encounter for screening mammogram for malignant neoplasm of breast: Secondary | ICD-10-CM

## 2013-09-12 ENCOUNTER — Ambulatory Visit
Admission: RE | Admit: 2013-09-12 | Discharge: 2013-09-12 | Disposition: A | Discharge status: Home / Self Care | Payer: Medicare Other | Source: Ambulatory Visit

## 2013-09-12 DIAGNOSIS — Z1231 Encounter for screening mammogram for malignant neoplasm of breast: Secondary | ICD-10-CM

## 2013-09-14 ENCOUNTER — Other Ambulatory Visit: Payer: Self-pay | Admitting: Geriatric Medicine

## 2013-09-14 DIAGNOSIS — R928 Other abnormal and inconclusive findings on diagnostic imaging of breast: Secondary | ICD-10-CM

## 2013-09-26 ENCOUNTER — Other Ambulatory Visit: Payer: Self-pay | Admitting: Geriatric Medicine

## 2013-09-26 ENCOUNTER — Ambulatory Visit
Admission: RE | Admit: 2013-09-26 | Discharge: 2013-09-26 | Disposition: A | Discharge status: Home / Self Care | Payer: Medicare Other | Source: Ambulatory Visit | Attending: Geriatric Medicine | Admitting: Geriatric Medicine

## 2013-09-26 DIAGNOSIS — R921 Mammographic calcification found on diagnostic imaging of breast: Secondary | ICD-10-CM

## 2013-09-26 DIAGNOSIS — R928 Other abnormal and inconclusive findings on diagnostic imaging of breast: Secondary | ICD-10-CM

## 2013-10-02 ENCOUNTER — Encounter (INDEPENDENT_AMBULATORY_CARE_PROVIDER_SITE_OTHER): Payer: Self-pay

## 2013-10-02 ENCOUNTER — Ambulatory Visit
Admission: RE | Admit: 2013-10-02 | Discharge: 2013-10-02 | Disposition: A | Discharge status: Home / Self Care | Payer: Medicare Other | Source: Ambulatory Visit | Attending: Geriatric Medicine | Admitting: Geriatric Medicine

## 2013-10-02 DIAGNOSIS — R921 Mammographic calcification found on diagnostic imaging of breast: Secondary | ICD-10-CM

## 2013-10-02 HISTORY — PX: BREAST BIOPSY: SHX20

## 2014-04-25 ENCOUNTER — Ambulatory Visit (INDEPENDENT_AMBULATORY_CARE_PROVIDER_SITE_OTHER): Payer: Medicare Other | Admitting: Cardiology

## 2014-04-25 ENCOUNTER — Encounter: Payer: Self-pay | Admitting: Cardiology

## 2014-04-25 VITALS — BP 116/74 | HR 126 | Ht 64.0 in | Wt 137.0 lb

## 2014-04-25 DIAGNOSIS — Q893 Situs inversus: Secondary | ICD-10-CM

## 2014-04-25 DIAGNOSIS — Z7901 Long term (current) use of anticoagulants: Secondary | ICD-10-CM

## 2014-04-25 DIAGNOSIS — I10 Essential (primary) hypertension: Secondary | ICD-10-CM

## 2014-04-25 DIAGNOSIS — I481 Persistent atrial fibrillation: Secondary | ICD-10-CM

## 2014-04-25 DIAGNOSIS — I4819 Other persistent atrial fibrillation: Secondary | ICD-10-CM

## 2014-04-25 DIAGNOSIS — Q240 Dextrocardia: Secondary | ICD-10-CM

## 2014-04-25 NOTE — Progress Notes (Signed)
[address] [place], [place]  [address] Phone: [phone] Fax:  [phone]  Date:  04/25/2014   ID:  Jill Harvey, DOB 09-30-1925, MRN [id number]  PCP:  Ginette Otto, MD   History of Present Illness: Jill Harvey is a 80 y.o. female with dextrocardia here for followup of new onset atrial fibrillation/atrial flutter which likely is exacerbating fatigue. Has a history of prior stroke. Dr. Pete Glatter, her primary physician, has discontinued Aggrenox and started Eliquis. I successfully cardioverted her on 05/28/13 to give her an opportunity of sinus rhythm however on her followup visit on 06/08/13, she was back in atrial fibrillation with rapid ventricular response. Honestly, she still does not feel any different pre-or post cardioversion. We will continue with rate control strategy. Following cardioversion, her heart rate was quite slow in the upper 40s, low 50s so I decreased medication. On 06/08/13 I decided to place her back on her diltiazem and increase her atenolol back to 50 mg as Dr. Pete Glatter had done previously.  Creatinine is 0.95, hemoglobin 12.7, TSH was normal. EKG 05/08/13 showed atrial/flutter, right axis deviation, may be result of situs inversus, nonspecific ST-T wave changes.  In November 2014, patient's daughter noted that she was becoming less able to perform physically she had in the past. More fatigued more quickly with regular activities. She felt lightheaded at times and had symptoms of dizziness but did not have syncope. She does not feel palpitations but she felt nervous in her chest.    07/13/13-overall doing very well, not feeling her atrial fibrillation, no syncope, no orthopnea, no shortness of breath, no chest pain. Tolerating her diltiazem and atenolol well. Her husband is a patient of Dr. Eldridge Dace.  04/25/14-no real trouble with breathing, syncope, dizziness. She still has increased heart rate. Checking  monitor.   Wt Readings from Last 3 Encounters:  04/25/14 137 lb (62.143 kg)  07/13/13 135 lb 12.8 oz (61.598 kg)  06/08/13 134 lb (60.782 kg)     Past Medical History  Diagnosis Date  . Osteoporosis   . Stroke   . Hypertension   . Hypercholesterolemia   . Macular degeneration   . Situs inversus totalis   . Osteoarthritis   . Hyperglycemia   . Vitamin D deficiency   . A-fib 05/14/2013  . HTN (hypertension) 05/14/2013  . Hyperlipemia 05/14/2013  . History of stroke 05/14/2013  . 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 Total abdominal hysterectomy    . Bilateral oophorectomy    . Cholecystectomy    . Cataract extraction Bilateral   . Bunionectomy Left   . Cardioversion N/A 05/28/2013    Procedure: CARDIOVERSION;  Surgeon: Donato Schultz, MD;  Location: [facility] ENDOSCOPY;  Service: Cardiovascular;  Laterality: N/A;    Current Outpatient Prescriptions  Medication Sig Dispense Refill  . alendronate (FOSAMAX) 70 MG tablet Take 70 mg by mouth once a week. Take with a full glass of water on an empty stomach.    Marland Kitchen apixaban (ELIQUIS) 2.5 MG TABS tablet Take 2.5 mg by mouth 2 (two) times daily.    Marland Kitchen atenolol (TENORMIN) 50 MG tablet Take 1 tablet (50 mg total) by mouth daily. 30 tablet 12  . atorvastatin (LIPITOR) 10 MG tablet Take 10 mg by mouth daily.    . beta carotene w/minerals (OCUVITE) tablet Take 1 tablet by mouth daily.    . Calcium Carbonate-Vitamin D 600-400 MG-UNIT per  tablet Take 1 tablet by mouth daily.    . cetirizine (ZYRTEC) 10 MG tablet Take 10 mg by mouth daily.    Marland Kitchen. diltiazem (CARDIZEM CD) 120 MG 24 hr capsule Take 1 capsule (120 mg total) by mouth daily. 30 capsule 12  . ergocalciferol (VITAMIN D2) 50000 UNITS capsule Take 50,000 Units by mouth once a week.    . furosemide (LASIX) 20 MG tablet Take 20 mg by mouth daily.    . Multiple Vitamin (MULTI VITAMIN DAILY PO) Take 1 tablet by mouth daily.    . Omega-3 Fatty Acids (FISH OIL)  1000 MG CAPS Take 1 capsule by mouth daily.     No current facility-administered medications for this visit.    Allergies:    Allergies  Allergen Reactions  . Amoxicillin   . Macrodantin [Nitrofurantoin Macrocrystal]     Social History:  The patient  reports that she has quit smoking. She does not have any smokeless tobacco history on file. She reports that she does not drink alcohol or use illicit drugs.  Havey husband.   Family History  Problem Relation Age of Onset  . Heart disease Father     ROS:  Please see the history of present illness.   Denies any fevers, chills, orthopnea, PND, syncope, chest pain. Recent weakness/fatigue. No bleeding.   All other systems reviewed and negative.   PHYSICAL EXAM: VS:  BP 116/74 mmHg  Pulse 126  Ht 5\' 4"  (1.626 m)  Wt 137 lb (62.143 kg)  BMI 23.50 kg/m2 Well nourished, well developed, in no acute distressElderly HEENT: normal, [place]/AT, EOMI Neck: no JVD, normal carotid upstroke, no bruit Cardiac: Irregularly irregular, mildly tachycardic; no murmur Lungs:  Cle/ar to auscultation bilaterally, no wheezing, rhonchi or rales Abd: soft, nontender, no hepatomegaly, no bruits Ext: no edema, 2+ distal pulses Skin: warm and dry GU: deferred Neuro: no focal abnormalities noted, AAO x 3  EKG:   04/25/14-atrial fibrillation with rapid ventricular response, 126, nonspecific ST-T wave changes consider lateral ischemia, right axis deviation, situs inversus. 05/08/13-atrial fibrillation/flutter heart rate 144 beats per minute with nonspecific ST-T wave changes. Right axis deviation noted. 06/08/13-atrial fibrillation rate 139, dextrocardia. Lab work as above. Prior medical records reviewed.  Echocardiogram: 05/15/13: - Left ventricle: The cavity size was normal. There was mild focal basal hypertrophy of the septum. Systolic function was mildly reduced. The estimated ejection fraction was in the range of 45% to 50%. Wall motion was normal;  there were no regional wall motion abnormalities. - Mitral valve: Calcified annulus. Mild regurgitation. - Right atrium: The atrium was mildly dilated. - Tricuspid valve: Mild-moderate regurgitation. - Pulmonary arteries: PA peak pressure: 40mm Hg (S). - Line: A venous catheter was visualized in the superior vena cava, with its tip in the right atrium. No abnormal features noted. Impressions:  - The right ventricular systolic pressure was increased consistent with mild pulmonary hypertension.     ASSESSMENT AND PLAN:  1. Atrial fibrillation-persistent. We performed a cardioversion which was successful on 05/28/13 however on 06/08/2013, she was once again in atrial fibrillation/flutter at 139 beats per minute. We've discussed the implications, treatment strategies both rhythm/rate control.   I restarted diltiazem CD 120 mg once a day at last visit  to further improve rate control as well as increase her atenolol once again to 50 mg. Continue with anticoagulation as already prescribed. I expressed the importance of anticoagulation in both rate or rhythm control.  She began anticoagulation on April 23, 2013. Cardioversion did  not hold. Her daughter, NP at [facility] in neurology was present for discussion.  Questions were answered about pacemaker/ablation as well. Today's EKG still shows increased heart rate. [place] is convinced that this increases secondary to walking from the parking lot to the building. Her heart rate at home when checked with her wrist monitor is usually in the 70s. I explained to her that this may be inaccurate. I would like to check a 24-hour Holter monitor prior to changing any of her medications to ensure that we are optimizing rate control. 2. Hypertension-currently well controlled. We will be careful given increase in atenolol recently as well as new start diltiazem.No longer on furosemide. 3. 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 Hyperlipidemia-atorvastatin. 5. We will see back in 3 months. We will make adjustments to her medications after Holter monitor.  Signed, Donato SchultzMark Kenidi Elenbaas, MD [facility]  04/25/2014 11:48 AM

## 2014-04-25 NOTE — Patient Instructions (Signed)
Your physician recommends that you schedule a follow-up appointment in: 3 months with Dr. Anne FuSkains  Your physician has recommended that you wear a holter monitor. Holter monitors are medical devices that record the heart's electrical activity. Doctors most often use these monitors to diagnose arrhythmias. Arrhythmias are problems with the speed or rhythm of the heartbeat. The monitor is a small, portable device. You can wear one while you do your normal daily activities. This is usually used to diagnose what is causing palpitations/syncope (passing out).

## 2014-05-07 ENCOUNTER — Encounter (INDEPENDENT_AMBULATORY_CARE_PROVIDER_SITE_OTHER): Payer: Medicare Other

## 2014-05-07 ENCOUNTER — Encounter: Payer: Self-pay | Admitting: *Deleted

## 2014-05-07 DIAGNOSIS — I480 Paroxysmal atrial fibrillation: Secondary | ICD-10-CM

## 2014-05-07 DIAGNOSIS — I4819 Other persistent atrial fibrillation: Secondary | ICD-10-CM

## 2014-05-07 NOTE — Progress Notes (Signed)
Patient ID: Jill Harvey, female   DOB: 1926/05/21, 80 y.o.   MRN: [id number] [facility] 24 hour holter monitor applied to patient.

## 2014-05-20 ENCOUNTER — Other Ambulatory Visit: Payer: Self-pay

## 2014-05-20 MED ORDER — DILTIAZEM HCL ER COATED BEADS 120 MG PO CP24: 120.0000 mg | ORAL_CAPSULE | Freq: Every day | ORAL | Status: DC

## 2014-05-20 MED ORDER — ATENOLOL 50 MG PO TABS: 50.0000 mg | ORAL_TABLET | Freq: Every day | ORAL | Status: DC

## 2014-08-05 ENCOUNTER — Ambulatory Visit: Payer: Medicare Other | Admitting: Cardiology

## 2014-08-14 ENCOUNTER — Other Ambulatory Visit: Payer: Self-pay

## 2014-08-14 DIAGNOSIS — Z1231 Encounter for screening mammogram for malignant neoplasm of breast: Secondary | ICD-10-CM

## 2014-08-26 ENCOUNTER — Ambulatory Visit: Payer: Medicare Other | Admitting: Cardiology

## 2014-09-02 ENCOUNTER — Ambulatory Visit (INDEPENDENT_AMBULATORY_CARE_PROVIDER_SITE_OTHER): Payer: Medicare Other | Admitting: Cardiology

## 2014-09-02 ENCOUNTER — Encounter: Payer: Self-pay | Admitting: Cardiology

## 2014-09-02 VITALS — BP 116/80 | HR 69 | Ht 64.0 in | Wt 138.0 lb

## 2014-09-02 DIAGNOSIS — Z7901 Long term (current) use of anticoagulants: Secondary | ICD-10-CM

## 2014-09-02 DIAGNOSIS — I10 Essential (primary) hypertension: Secondary | ICD-10-CM

## 2014-09-02 DIAGNOSIS — I480 Paroxysmal atrial fibrillation: Secondary | ICD-10-CM

## 2014-09-02 NOTE — Progress Notes (Signed)
[address] [place], [place]  [address] Phone: [phone] Fax:  [phone]  Date:  09/02/2014   ID:  Jill Harvey, DOB Aug 15, 1925, MRN [id number]  PCP:  Ginette Otto, MD   History of Present Illness: Jill Harvey is a 81 y.o. female with dextrocardia here for followup of atrial fibrillation/atrial flutter. We tried cardioversion which originally was successful however after several days, she reverted back to atrial fibrillation. Holter monitor demonstrated heart rate averaged 95 bpm. Has a history of prior stroke.  In summary, Dr. Pete Glatter, her primary physician, had discontinued Aggrenox and started Eliquis. I successfully cardioverted her on 05/28/13 to give her an opportunity of sinus rhythm however on her followup visit on 06/08/13, she was back in atrial fibrillation with rapid ventricular response. Honestly, she still does not feel any different pre-or post cardioversion. We will continue with rate control strategy. Following cardioversion, her heart rate was quite slow in the upper 40s, low 50s so I decreased medication.  On 06/08/13 I decided to place her back on her diltiazem and increase her atenolol back to 50 mg as Dr. Pete Glatter had done previously.  Creatinine is 0.95, hemoglobin 12.7, TSH was normal. EKG 05/08/13 showed atrial/flutter, right axis deviation, may be result of situs inversus, nonspecific ST-T wave changes.  In November 2014 prior to initial cardioversion, patient's daughter noted that she was becoming less able to perform physically she had in the past. More fatigued more quickly with regular activities. She felt lightheaded at times and had symptoms of dizziness but did not have syncope. She does not feel palpitations but she felt nervous in her chest.    07/13/13-overall doing very well, not feeling her atrial fibrillation, no syncope, no orthopnea, no shortness of breath, no chest pain. Tolerating her  diltiazem and atenolol well. Her husband is a patient of Dr. Eldridge Dace.  04/25/14-no real trouble with breathing, syncope, dizziness. She still has increased heart rate. Checking monitor.  09/02/14 - Holter monitor demonstrated overall average heart rate of 95 bpm with persistent atrial fibrillation. No changes in rate control were made. No symptoms. Doing well.   Wt Readings from Last 3 Encounters:  09/02/14 138 lb (62.596 kg)  04/25/14 137 lb (62.143 kg)  07/13/13 135 lb 12.8 oz (61.598 kg)     Past Medical History  Diagnosis Date  . Osteoporosis   . Stroke   . Hypertension   . Hypercholesterolemia   . Macular degeneration   . Situs inversus totalis   . Osteoarthritis   . Hyperglycemia   . Vitamin D deficiency   . A-fib 05/14/2013  . HTN (hypertension) 05/14/2013  . Hyperlipemia 05/14/2013  . History of stroke 05/14/2013  . 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 Total abdominal hysterectomy    . Bilateral oophorectomy    . Cholecystectomy    . Cataract extraction Bilateral   . Bunionectomy Left   . Cardioversion N/A 05/28/2013    Procedure: CARDIOVERSION;  Surgeon: Donato Schultz, MD;  Location: [facility] ENDOSCOPY;  Service: Cardiovascular;  Laterality: N/A;    Current Outpatient Prescriptions  Medication Sig Dispense Refill  . alendronate (FOSAMAX) 70 MG tablet Take 70 mg by mouth once a week. Take with a full glass of water on an empty stomach.    Marland Kitchen apixaban (ELIQUIS) 2.5 MG TABS tablet Take 2.5 mg by mouth 2 (two) times daily.    Marland Kitchen atenolol (  TENORMIN) 50 MG tablet Take 1 tablet (50 mg total) by mouth daily. 90 tablet 2  . atorvastatin (LIPITOR) 10 MG tablet Take 10 mg by mouth daily.    . beta carotene w/minerals (OCUVITE) tablet Take 1 tablet by mouth daily.    . Calcium Carbonate-Vitamin D 600-400 MG-UNIT per tablet Take 1 tablet by mouth daily.    . cetirizine (ZYRTEC) 10 MG tablet Take 10 mg by mouth daily.    Marland Kitchen. diltiazem (CARDIZEM  CD) 120 MG 24 hr capsule Take 1 capsule (120 mg total) by mouth daily. 90 capsule 2  . ergocalciferol (VITAMIN D2) 50000 UNITS capsule Take 50,000 Units by mouth once a week.    . Multiple Vitamin (MULTI VITAMIN DAILY PO) Take 1 tablet by mouth daily.    . Omega-3 Fatty Acids (FISH OIL) 1000 MG CAPS Take 1 capsule by mouth daily.     No current facility-administered medications for this visit.    Allergies:    Allergies  Allergen Reactions  . Amoxicillin   . Macrodantin [Nitrofurantoin Macrocrystal]     Social History:  The patient  reports that she has quit smoking. She does not have any smokeless tobacco history on file. She reports that she does not drink alcohol or use illicit drugs.  [name] husband.   Family History  Problem Relation Age of Onset  . Heart disease Father     ROS:  Please see the history of present illness.   Denies any fevers, chills, orthopnea, PND, syncope, chest pain. Recent weakness/fatigue. No bleeding.   All other systems reviewed and negative.   PHYSICAL EXAM: VS:  BP 116/80 mmHg  Pulse 69  Ht 5\' 4"  (1.626 m)  Wt 138 lb (62.596 kg)  BMI 23.68 kg/m2 Well nourished, well developed, in no acute distressElderly HEENT: normal, [place]/AT, EOMI Neck: no JVD, normal carotid upstroke, no bruit Cardiac: Irregularly irregular, mildly tachycardic; no murmur Lungs:  Cle/ar to auscultation bilaterally, no wheezing, rhonchi or rales Abd: soft, nontender, no hepatomegaly, no bruits Ext: no edema, 2+ distal pulses Skin: warm and dry GU: deferred Neuro: no focal abnormalities noted, AAO x 3  EKG:   04/25/14-atrial fibrillation with rapid ventricular response, 126, nonspecific ST-T wave changes consider lateral ischemia, right axis deviation, situs inversus. 05/08/13-atrial fibrillation/flutter heart rate 144 beats per minute with nonspecific ST-T wave changes. Right axis deviation noted. 06/08/13-atrial fibrillation rate 139, dextrocardia. Lab work as above. Prior  medical records reviewed.  Echocardiogram: 05/15/13: - Left ventricle: The cavity size was normal. There was mild focal basal hypertrophy of the septum. Systolic function was mildly reduced. The estimated ejection fraction was in the range of 45% to 50%. Wall motion was normal; there were no regional wall motion abnormalities. - Mitral valve: Calcified annulus. Mild regurgitation. - Right atrium: The atrium was mildly dilated. - Tricuspid valve: Mild-moderate regurgitation. - Pulmonary arteries: PA peak pressure: 40mm Hg (S). - Line: A venous catheter was visualized in the superior vena cava, with its tip in the right atrium. No abnormal features noted. Impressions:  - The right ventricular systolic pressure was increased consistent with mild pulmonary hypertension.  Holter 07/2014 - Mean rate 95bpm. No change in meds.    ASSESSMENT AND PLAN:  1. Atrial fibrillation-persistent. We performed a cardioversion which was successful on 05/28/13 however on 06/08/2013, she was once again in atrial fibrillation/flutter at 139 beats per minute. We've discussed the implications, treatment strategies both rhythm/rate control.   I restarted diltiazem CD 120 mg once a  day at last visit  to further improve rate control as well as increase her atenolol once again to 50 mg. Continue with anticoagulation as already prescribed. I expressed the importance of anticoagulation in both rate or rhythm control.  She began anticoagulation on April 23, 2013. Cardioversion did not hold. Her daughter, NP at [facility] in neurology was present for discussion.  Questions were answered about pacemaker/ablation as well. Holter monitor showed overall average heart rate of 95 bpm. Fairly adequate rate control. Longest pause was 2.7 seconds. No changes made in medications. Occasionally auscultation will demonstrate tachycardia. Asymptomatic. Dr. Pete Glatter has been monitoring blood work. 2. Hypertension-currently well controlled. We  will be careful given increase in atenolol recently as well as new start diltiazem.No longer on furosemide. 3. 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 Hyperlipidemia-atorvastatin. 5. We will see back in 3 months.   Signed, Donato Schultz, MD [facility]  09/02/2014 9:35 AM

## 2014-09-02 NOTE — Patient Instructions (Signed)
The current medical regimen is effective;  continue present plan and medications.  Follow up in 6 months with Dr. Skains.  You will receive a letter in the mail 2 months before you are due.  Please call us when you receive this letter to schedule your follow up appointment.  Thank you for choosing [facility]!!     

## 2014-09-16 ENCOUNTER — Ambulatory Visit
Admission: RE | Admit: 2014-09-16 | Discharge: 2014-09-16 | Disposition: A | Discharge status: Home / Self Care | Payer: Medicare Other | Source: Ambulatory Visit

## 2014-09-16 DIAGNOSIS — Z1231 Encounter for screening mammogram for malignant neoplasm of breast: Secondary | ICD-10-CM

## 2015-02-10 ENCOUNTER — Other Ambulatory Visit: Payer: Self-pay | Admitting: Cardiology

## 2015-03-03 ENCOUNTER — Ambulatory Visit (INDEPENDENT_AMBULATORY_CARE_PROVIDER_SITE_OTHER): Payer: Medicare Other | Admitting: Cardiology

## 2015-03-03 ENCOUNTER — Encounter: Payer: Self-pay | Admitting: Cardiology

## 2015-03-03 VITALS — BP 132/84 | HR 75 | Ht 64.0 in | Wt 137.8 lb

## 2015-03-03 DIAGNOSIS — I10 Essential (primary) hypertension: Secondary | ICD-10-CM

## 2015-03-03 DIAGNOSIS — E785 Hyperlipidemia, unspecified: Secondary | ICD-10-CM

## 2015-03-03 DIAGNOSIS — I4819 Other persistent atrial fibrillation: Secondary | ICD-10-CM

## 2015-03-03 DIAGNOSIS — Q240 Dextrocardia: Secondary | ICD-10-CM

## 2015-03-03 DIAGNOSIS — I481 Persistent atrial fibrillation: Secondary | ICD-10-CM | POA: Diagnosis not present

## 2015-03-03 DIAGNOSIS — Z8673 Personal history of transient ischemic attack (TIA), and cerebral infarction without residual deficits: Secondary | ICD-10-CM

## 2015-03-03 DIAGNOSIS — Z7901 Long term (current) use of anticoagulants: Secondary | ICD-10-CM | POA: Diagnosis not present

## 2015-03-03 DIAGNOSIS — Q893 Situs inversus: Secondary | ICD-10-CM

## 2015-03-03 NOTE — Patient Instructions (Addendum)
Medication Instructions:  The current medical regimen is effective;  continue present plan and medications.  Lab: Please have blood work today (CBC,BMP)  Follow-Up: Follow up in 6 months with Dr. Anne Fu.  You will receive a letter in the mail 2 months before you are due.  Please call us when you receive this letter to schedule your follow up appointment.  Thank you for choosing [facility]!!

## 2015-03-03 NOTE — Progress Notes (Signed)
[address] [place], [place]  [address] Phone: [phone] Fax:  [phone]  Date:  03/03/2015   ID:  Jill Harvey, DOB 1925-07-12, MRN [id number]  PCP:  Ginette Otto, MD   History of Present Illness: Jill Harvey is a 81 y.o. female with dextrocardia here for followup of atrial fibrillation/atrial flutter. We tried cardioversion which originally was successful however after several days, she reverted back to atrial fibrillation. Holter monitor demonstrated heart rate averaged 95 bpm. Has a history of prior stroke.  In summary, Dr. Pete Glatter, her primary physician, had discontinued Aggrenox and started Eliquis. I successfully cardioverted her on 05/28/13 to give her an opportunity of sinus rhythm however on her followup visit on 06/08/13, she was back in atrial fibrillation with rapid ventricular response. Honestly, she still does not feel any different pre-or post cardioversion. We will continue with rate control strategy. Following cardioversion, her heart rate was quite slow in the upper 40s, low 50s so I decreased medication.  On 06/08/13 I decided to place her back on her diltiazem and increase her atenolol back to 50 mg as Dr. Pete Glatter had done previously.  Creatinine is 0.95, hemoglobin 12.7, TSH was normal. EKG 05/08/13 showed atrial/flutter, right axis deviation, may be result of situs inversus, nonspecific ST-T wave changes.  In November 2014 prior to initial cardioversion, patient's daughter noted that she was becoming less able to perform physically she had in the past. More fatigued more quickly with regular activities. She felt lightheaded at times and had symptoms of dizziness but did not have syncope. She does not feel palpitations but she felt nervous in her chest.    07/13/13-overall doing very well, not feeling her atrial fibrillation, no syncope, no orthopnea, no shortness of breath, no chest pain. Tolerating her  diltiazem and atenolol well. Her husband is a patient of Dr. Eldridge Dace.  04/25/14-no real trouble with breathing, syncope, dizziness. She still has increased heart rate. Checking monitor.  09/02/14 - Holter monitor demonstrated overall average heart rate of 95 bpm with persistent atrial fibrillation. No changes in rate control were made. No symptoms. Doing well.  03/03/15-stable. No bleeding. Doing well. She is very kind to bring cookies. No syncope, no shortness of breath, no chest pain. About to get lab work with Dr. Pete Glatter in November.  Wt Readings from Last 3 Encounters:  03/03/15 137 lb 12.8 oz (62.506 kg)  09/02/14 138 lb (62.596 kg)  04/25/14 137 lb (62.143 kg)     Past Medical History  Diagnosis Date  . Osteoporosis   . Stroke   . Hypertension   . Hypercholesterolemia   . Macular degeneration   . Situs inversus totalis   . Osteoarthritis   . Hyperglycemia   . Vitamin D deficiency   . A-fib 05/14/2013  . HTN (hypertension) 05/14/2013  . Hyperlipemia 05/14/2013  . History of stroke 05/14/2013  . 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 Total abdominal hysterectomy    . Bilateral oophorectomy    . Cholecystectomy    . Cataract extraction Bilateral   . Bunionectomy Left   . Cardioversion N/A 05/28/2013    Procedure: CARDIOVERSION;  Surgeon: Donato Schultz, MD;  Location: [facility] ENDOSCOPY;  Service: Cardiovascular;  Laterality: N/A;    Current Outpatient Prescriptions  Medication Sig Dispense Refill  . alendronate (FOSAMAX) 70 MG tablet Take 70 mg by mouth once a week. Take with a full  glass of water on an empty stomach.    Marland Kitchen apixaban (ELIQUIS) 2.5 MG TABS tablet Take 2.5 mg by mouth 2 (two) times daily.    Marland Kitchen atenolol (TENORMIN) 50 MG tablet TAKE 1 TABLET DAILY 90 tablet 1  . atorvastatin (LIPITOR) 10 MG tablet Take 10 mg by mouth daily.    . beta carotene w/minerals (OCUVITE) tablet Take 1 tablet by mouth daily.    . Calcium  Carbonate-Vitamin D 600-400 MG-UNIT per tablet Take 1 tablet by mouth daily.    Marland Kitchen CARTIA XT 120 MG 24 hr capsule TAKE 1 CAPSULE DAILY 90 capsule 1  . cetirizine (ZYRTEC) 10 MG tablet Take 10 mg by mouth daily.    . ergocalciferol (VITAMIN D2) 50000 UNITS capsule Take 50,000 Units by mouth once a week.    . Multiple Vitamin (MULTI VITAMIN DAILY PO) Take 1 tablet by mouth daily.    . Omega-3 Fatty Acids (FISH OIL) 1000 MG CAPS Take 1 capsule by mouth daily.     No current facility-administered medications for this visit.    Allergies:    Allergies  Allergen Reactions  . Amoxicillin   . Macrodantin [Nitrofurantoin Macrocrystal]     Social History:  The patient  reports that she has quit smoking. She does not have any smokeless tobacco history on file. She reports that she does not drink alcohol or use illicit drugs.  [name] husband.   Family History  Problem Relation Age of Onset  . Heart disease Father     ROS:  Please see the history of present illness.   Denies any fevers, chills, orthopnea, PND, syncope, chest pain. Recent weakness/fatigue. No bleeding.   All other systems reviewed and negative.   PHYSICAL EXAM: VS:  BP 132/84 mmHg  Pulse 75  Ht  (1.626 m)  Wt 137 lb 12.8 oz (62.506 kg)  BMI 23.64 kg/m2  SpO2 98% Well nourished, well developed, in no acute distressElderly HEENT: normal, [place]/AT, EOMI Neck: no JVD, normal carotid upstroke, no bruit Cardiac: Irregularly irregular, mildly tachycardic; no murmur Lungs:  Cle/ar to auscultation bilaterally, no wheezing, rhonchi or rales Abd: soft, nontender, no hepatomegaly, no bruits Ext: no edema, 2+ distal pulses Skin: warm and dry GU: deferred Neuro: no focal abnormalities noted, AAO x 3  EKG:   04/25/14-atrial fibrillation with rapid ventricular response, 126, nonspecific ST-T wave changes consider lateral ischemia, right axis deviation, situs inversus. 05/08/13-atrial fibrillation/flutter heart rate 144 beats per  minute with nonspecific ST-T wave changes. Right axis deviation noted. 06/08/13-atrial fibrillation rate 139, dextrocardia. Lab work as above. Prior medical records reviewed.  Echocardiogram: 05/15/13: - Left ventricle: The cavity size was normal. There was mild focal basal hypertrophy of the septum. Systolic function was mildly reduced. The estimated ejection fraction was in the range of 45% to 50%. Wall motion was normal; there were no regional wall motion abnormalities. - Mitral valve: Calcified annulus. Mild regurgitation. - Right atrium: The atrium was mildly dilated. - Tricuspid valve: Mild-moderate regurgitation. - Pulmonary arteries: PA peak pressure: 40mm Hg (S). - Line: A venous catheter was visualized in the superior vena cava, with its tip in the right atrium. No abnormal features noted. Impressions:  - The right ventricular systolic pressure was increased consistent with mild pulmonary hypertension.  Holter 07/2014 - Mean rate 95bpm. No change in meds.    ASSESSMENT AND PLAN:  1. Atrial fibrillation-persistent. We performed a cardioversion which was successful on 05/28/13 however on 06/08/2013, she was once again in atrial  fibrillation/flutter at 139 beats per minute. We've discussed the implications, treatment strategies both rhythm/rate control.   I restarted diltiazem CD 120 mg once a day at last visit  to further improve rate control as well as increase her atenolol once again to 50 mg. Continue with anticoagulation as already prescribed. I'm using the lower dose because of her age greater than 87 and weight approximately 60 kg. I expressed the importance of anticoagulation in both rate or rhythm control.  She began anticoagulation on April 23, 2013. Cardioversion did not hold. Her daughter, NP at [facility] in neurology was present for prior discussion.  Questions were answered about pacemaker/ablation as well. Holter monitor showed overall average heart rate of 95 bpm. Fairly  adequate rate control. Longest pause was 2.7 seconds. No changes made in medications. Occasionally auscultation will demonstrate tachycardia. Asymptomatic.  2. Hypertension-currently well controlled. We will be careful given increase in atenolol recently as well as new start diltiazem.No longer on furosemide. 3. 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 Hyperlipidemia-atorvastatin. 5. Chronic anti-coag relation-Eliquis 2.5 g twice a day, age greater than 85, weight approximately 60 kg. No bleeding. 6. We will see back in 6 months.   Signed, Donato Schultz, MD [facility]  03/03/2015 10:28 AM

## 2015-05-15 ENCOUNTER — Other Ambulatory Visit: Payer: Self-pay | Admitting: Cardiology

## 2015-08-21 ENCOUNTER — Other Ambulatory Visit: Payer: Self-pay | Admitting: Geriatric Medicine

## 2015-08-21 DIAGNOSIS — K769 Liver disease, unspecified: Secondary | ICD-10-CM

## 2015-09-01 ENCOUNTER — Ambulatory Visit: Payer: Medicare Other | Admitting: Cardiology

## 2015-09-02 ENCOUNTER — Ambulatory Visit
Admission: RE | Admit: 2015-09-02 | Discharge: 2015-09-02 | Disposition: A | Discharge status: Home / Self Care | Payer: Medicare Other | Source: Ambulatory Visit | Attending: Geriatric Medicine | Admitting: Geriatric Medicine

## 2015-09-02 DIAGNOSIS — K769 Liver disease, unspecified: Secondary | ICD-10-CM

## 2015-09-02 MED ORDER — GADOBENATE DIMEGLUMINE 529 MG/ML IV SOLN
13.0000 mL | Freq: Once | INTRAVENOUS | Status: AC | PRN
  Administered 2015-09-02: 13 mL via INTRAVENOUS

## 2015-09-23 ENCOUNTER — Other Ambulatory Visit: Payer: Self-pay | Admitting: Cardiology

## 2015-10-01 ENCOUNTER — Ambulatory Visit (INDEPENDENT_AMBULATORY_CARE_PROVIDER_SITE_OTHER): Payer: Medicare Other | Admitting: Cardiology

## 2015-10-01 ENCOUNTER — Encounter: Payer: Self-pay | Admitting: Cardiology

## 2015-10-01 VITALS — BP 116/70 | HR 91 | Ht 64.0 in | Wt 135.2 lb

## 2015-10-01 DIAGNOSIS — Q240 Dextrocardia: Secondary | ICD-10-CM | POA: Diagnosis not present

## 2015-10-01 DIAGNOSIS — I4819 Other persistent atrial fibrillation: Secondary | ICD-10-CM

## 2015-10-01 DIAGNOSIS — I10 Essential (primary) hypertension: Secondary | ICD-10-CM

## 2015-10-01 DIAGNOSIS — Z7901 Long term (current) use of anticoagulants: Secondary | ICD-10-CM

## 2015-10-01 DIAGNOSIS — I481 Persistent atrial fibrillation: Secondary | ICD-10-CM

## 2015-10-01 DIAGNOSIS — E785 Hyperlipidemia, unspecified: Secondary | ICD-10-CM

## 2015-10-01 NOTE — Patient Instructions (Signed)

## 2015-10-01 NOTE — Progress Notes (Signed)
[address] [place], [place]  [address] Phone: [phone] Fax:  [phone]  Date:  10/01/2015   ID:  Jill Harvey, DOB 04/18/1926, MRN [id number]  PCP:  Jill Otto, MD   History of Present Illness: Jill Harvey is a 82 y.o. female with dextrocardia here for followup of atrial fibrillation/atrial flutter. We tried cardioversion which originally was successful however after several days, she reverted back to atrial fibrillation. Holter monitor demonstrated heart rate averaged 95 bpm. Has a history of prior stroke.  In summary, Dr. Pete Harvey, her primary physician, had discontinued Aggrenox and started Eliquis. I successfully cardioverted her on 05/28/13 to give her an opportunity of sinus rhythm however on her followup visit on 06/08/13, she was back in atrial fibrillation with rapid ventricular response. Honestly, she still does not feel any different pre-or post cardioversion. We will continue with rate control strategy. Following cardioversion, her heart rate was quite slow in the upper 40s, low 50s so I decreased medication.  On 06/08/13 I decided to place her back on her diltiazem and increase her atenolol back to 50 mg as Dr. Pete Harvey had done previously.  Creatinine is 0.95, hemoglobin 12.7, TSH was normal. EKG 05/08/13 showed atrial/flutter, right axis deviation, may be result of situs inversus, nonspecific ST-T wave changes.  In November 2014 prior to initial cardioversion, patient's daughter noted that she was becoming less able to perform physically she had in the past. More fatigued more quickly with regular activities. She felt lightheaded at times and had symptoms of dizziness but did not have syncope. She does not feel palpitations but she felt nervous in her chest.    07/13/13-overall doing very well, not feeling her atrial fibrillation, no syncope, no orthopnea, no shortness of breath, no chest pain. Tolerating her  diltiazem and atenolol well. Her husband is a patient of Dr. Eldridge Harvey.  04/25/14-no real trouble with breathing, syncope, dizziness. She still has increased heart rate. Checking monitor.  09/02/14 - Holter monitor demonstrated overall average heart rate of 95 bpm with persistent atrial fibrillation. No changes in rate control were made. No symptoms. Doing well.  03/03/15-stable. No bleeding. Doing well. She is very kind to bring cookies. No syncope, no shortness of breath, no chest pain. About to get lab work with Dr. Pete Harvey in November.  10/01/15-overall feeling very well. 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 She's been stable, no falls, no bleeding, no chest pain, no syncope, no orthopnea.  Wt Readings from Last 3 Encounters:  10/01/15 135 lb 3.2 oz (61.326 kg)  09/02/15 142 lb (64.411 kg)  03/03/15 137 lb 12.8 oz (62.506 kg)     Past Medical History  Diagnosis Date  . Osteoporosis   . Stroke (HCC)   . Hypertension   . Hypercholesterolemia   . Macular degeneration   . Situs inversus totalis   . Osteoarthritis   . Hyperglycemia   . Vitamin D deficiency   . A-fib (HCC) 05/14/2013  . HTN (hypertension) 05/14/2013  . Hyperlipemia 05/14/2013  . History of stroke 05/14/2013  . 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 Total abdominal hysterectomy    . Bilateral oophorectomy    . Cholecystectomy    . Cataract extraction Bilateral   . Bunionectomy Left   . Cardioversion N/A 05/28/2013    Procedure: CARDIOVERSION;  Surgeon: Donato Schultz, MD;  Location:  [facility] ENDOSCOPY;  Service: Cardiovascular;  Laterality: N/A;    Current Outpatient Prescriptions  Medication Sig Dispense Refill  . alendronate (FOSAMAX) 70 MG tablet Take 70 mg by mouth once a week. Take with a full glass of water on an empty stomach.    Marland Kitchen apixaban (ELIQUIS) 2.5 MG TABS tablet Take 2.5 mg by mouth 2 (two) times daily.    Marland Kitchen atenolol (TENORMIN)  50 MG tablet TAKE 1 TABLET DAILY 90 tablet 1  . atorvastatin (LIPITOR) 10 MG tablet Take 10 mg by mouth daily.    . beta carotene w/minerals (OCUVITE) tablet Take 1 tablet by mouth daily.    . Calcium Carbonate-Vitamin D 600-400 MG-UNIT per tablet Take 1 tablet by mouth daily.    Marland Kitchen CARTIA XT 120 MG 24 hr capsule TAKE 1 CAPSULE DAILY 90 capsule 1  . cetirizine (ZYRTEC) 10 MG tablet Take 10 mg by mouth daily.    . ergocalciferol (VITAMIN D2) 50000 UNITS capsule Take 50,000 Units by mouth once a week.    . Multiple Vitamin (MULTI VITAMIN DAILY PO) Take 1 tablet by mouth daily.    . Omega-3 Fatty Acids (FISH OIL) 1000 MG CAPS Take 1 capsule by mouth daily.     No current facility-administered medications for this visit.    Allergies:    Allergies  Allergen Reactions  . Amoxicillin   . Macrodantin [Nitrofurantoin Macrocrystal]     Social History:  The patient  reports that she has quit smoking. She does not have any smokeless tobacco history on file. She reports that she does not drink alcohol or use illicit drugs.  Jill Harvey husband.   Family History  Problem Relation Age of Onset  . Heart disease Father     ROS:  Please see the history of present illness.   Denies any fevers, chills, orthopnea, PND, syncope, chest pain. Recent weakness/fatigue. No bleeding.   All other systems reviewed and negative.   PHYSICAL EXAM: VS:  BP 116/70 mmHg  Wt 135 lb 3.2 oz (61.326 kg) Well nourished, well developed, in no acute distressElderly HEENT: normal, [place]/AT, EOMI Neck: no JVD, normal carotid upstroke, no bruit Cardiac: Irregularly irregular, mildly tachycardic; no murmur Lungs:  Cle/ar to auscultation bilaterally, no wheezing, rhonchi or rales Abd: soft, nontender, no hepatomegaly, no bruits Ext: no edema, 2+ distal pulses Skin: warm and dry GU: deferred Neuro: no focal abnormalities noted, AAO x 3  EKG:   EKG ordered today 10/01/15-atrial fibrillation rate 91 bpm with T-wave inversion  inferior laterally, consider ischemia. These T-wave inversions were seen on prior study. Personally viewed. 04/25/14-atrial fibrillation with rapid ventricular response, 126, nonspecific ST-T wave changes consider lateral ischemia, right axis deviation, situs inversus. 05/08/13-atrial fibrillation/flutter heart rate 144 beats per minute with nonspecific ST-T wave changes. Right axis deviation noted. 06/08/13-atrial fibrillation rate 139, dextrocardia. Lab work as above. Prior medical records reviewed.  Echocardiogram: 05/15/13: - Left ventricle: The cavity size was normal. There was mild focal basal hypertrophy of the septum. Systolic function was mildly reduced. The estimated ejection fraction was in the range of 45% to 50%. Wall motion was normal; there were no regional wall motion abnormalities. - Mitral valve: Calcified annulus. Mild regurgitation. - Right atrium: The atrium was mildly dilated. - Tricuspid valve: Mild-moderate regurgitation. - Pulmonary arteries: PA peak pressure: 40mm Hg (S). - Line: A venous catheter was visualized in the superior vena cava, with its tip in the right atrium. No abnormal features noted. Impressions:  - The right ventricular systolic  pressure was increased consistent with mild pulmonary hypertension.  Holter 07/2014 - Mean rate 95bpm. No change in meds.    ASSESSMENT AND PLAN:  1. Atrial fibrillation-persistent. We performed a cardioversion which was successful on 05/28/13 however on 06/08/2013, she was once again in atrial fibrillation/flutter at 139 beats per minute. We've discussed the implications, treatment strategies both rhythm/rate control.   I restarted diltiazem CD 120 mg once a day to further improve rate control as well as increase her atenolol once again to 50 mg. Continue with anticoagulation as already prescribed. I'm using the lower dose because of her age greater than 87 and weight approximately 60 kg. I expressed the importance of  anticoagulation in both rate or rhythm control.  She began anticoagulation on April 23, 2013. Cardioversion did not hold. [facility] was present for prior discussion.  Questions were answered at a prior visit about pacemaker/ablation as well. Holter monitor showed overall average heart rate of 95 bpm. Fairly adequate rate control. Longest pause was 2.7 seconds. No changes made in medications. Asymptomatic.  2. Hypertension-currently well controlled. No longer on furosemide. She states that her leg swelling has been under good control. 3. 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 Hyperlipidemia-atorvastatin. 5. Chronic anti-coagaltion-Eliquis 2.5 mg twice a day, age greater than 85, weight approximately 60 kg. No bleeding. 6. We will see back in 6 months.   Signed, Donato SchultzMark Novie Maggio, MD [facility]  10/01/2015 8:28 AM

## 2015-10-30 ENCOUNTER — Other Ambulatory Visit: Payer: Self-pay

## 2015-10-30 DIAGNOSIS — Z1231 Encounter for screening mammogram for malignant neoplasm of breast: Secondary | ICD-10-CM

## 2015-11-20 ENCOUNTER — Other Ambulatory Visit: Payer: Self-pay | Admitting: Geriatric Medicine

## 2015-11-20 ENCOUNTER — Ambulatory Visit
Admission: RE | Admit: 2015-11-20 | Discharge: 2015-11-20 | Disposition: A | Discharge status: Home / Self Care | Payer: Medicare Other | Source: Ambulatory Visit

## 2015-11-20 DIAGNOSIS — Z1231 Encounter for screening mammogram for malignant neoplasm of breast: Secondary | ICD-10-CM

## 2015-11-20 DIAGNOSIS — H6123 Impacted cerumen, bilateral: Secondary | ICD-10-CM | POA: Insufficient documentation

## 2016-03-01 ENCOUNTER — Other Ambulatory Visit: Payer: Self-pay | Admitting: Cardiology

## 2016-03-25 ENCOUNTER — Encounter: Payer: Self-pay | Admitting: *Deleted

## 2016-04-07 ENCOUNTER — Ambulatory Visit (INDEPENDENT_AMBULATORY_CARE_PROVIDER_SITE_OTHER): Payer: Medicare Other | Admitting: Cardiology

## 2016-04-07 ENCOUNTER — Encounter: Payer: Self-pay | Admitting: Cardiology

## 2016-04-07 VITALS — BP 126/78 | HR 90 | Ht 64.5 in | Wt 135.0 lb

## 2016-04-07 DIAGNOSIS — I10 Essential (primary) hypertension: Secondary | ICD-10-CM

## 2016-04-07 DIAGNOSIS — Z8673 Personal history of transient ischemic attack (TIA), and cerebral infarction without residual deficits: Secondary | ICD-10-CM

## 2016-04-07 DIAGNOSIS — I481 Persistent atrial fibrillation: Secondary | ICD-10-CM

## 2016-04-07 DIAGNOSIS — Q240 Dextrocardia: Secondary | ICD-10-CM

## 2016-04-07 DIAGNOSIS — Z7901 Long term (current) use of anticoagulants: Secondary | ICD-10-CM

## 2016-04-07 DIAGNOSIS — I4819 Other persistent atrial fibrillation: Secondary | ICD-10-CM

## 2016-04-07 NOTE — Patient Instructions (Signed)
Your physician recommends that you continue on your current medications as directed. Please refer to the Current Medication list given to you today.  Your physician wants you to follow-up in: 1 year with Dr. Skains.  You will receive a reminder letter in the mail two months in advance. If you don't receive a letter, please call our office to schedule the follow-up appointment.  Please contact your pharmacy for any refill needs! 

## 2016-04-07 NOTE — Progress Notes (Signed)
[address] [place], [place]  [address] Phone: [phone] Fax:  [phone]  Date:  04/07/2016   ID:  Jill LanceVirginia B Shorts, DOB 10-Sep-1925, MRN [id number]  PCP:  Ginette OttoSTONEKING,HAL THOMAS, MD   History of Present Illness: Jill Harvey is a 82 y.o. female with dextrocardia here for followup of atrial fibrillation/atrial flutter. We tried cardioversion which originally was successful however after several days, she reverted back to atrial fibrillation. Holter monitor demonstrated heart rate averaged 95 bpm. Has a history of prior stroke.  In summary, Dr. Pete GlatterStoneking, her primary physician, had discontinued Aggrenox and started Eliquis. I successfully cardioverted her on 05/28/13 to give her an opportunity of sinus rhythm however on her followup visit on 06/08/13, she was back in atrial fibrillation with rapid ventricular response. Honestly, she still does not feel any different pre-or post cardioversion. We will continue with rate control strategy. Following cardioversion, her heart rate was quite slow in the upper 40s, low 50s so I decreased medication.  On 06/08/13 I decided to place her back on her diltiazem and increase her atenolol back to 50 mg as Dr. Pete GlatterStoneking had done previously.  Creatinine is 0.95, hemoglobin 12.7, TSH was normal. EKG 05/08/13 showed atrial/flutter, right axis deviation, may be result of situs inversus, nonspecific ST-T wave changes.  In November 2014 prior to initial cardioversion, patient's daughter noted that she was becoming less able to perform physically she had in the past. More fatigued more quickly with regular activities. She felt lightheaded at times and had symptoms of dizziness but did not have syncope. She does not feel palpitations but she felt nervous in her chest.    07/13/13-overall doing very well, not feeling her atrial fibrillation, no syncope, no orthopnea, no shortness of breath, no chest pain. Tolerating  her diltiazem and atenolol well. Her husband is a patient of Dr. Eldridge DaceVaranasi.  04/25/14-no real trouble with breathing, syncope, dizziness. She still has increased heart rate. Checking monitor.  09/02/14 - Holter monitor demonstrated overall average heart rate of 95 bpm with persistent atrial fibrillation. No changes in rate control were made. No symptoms. Doing well.  03/03/15-stable. No bleeding. Doing well. She is very kind to bring cookies. No syncope, no shortness of breath, no chest pain. About to get lab work with Dr. Pete GlatterStoneking in November.  10/01/15-overall feeling very well. 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 She's been stable, no falls, no bleeding, no chest pain, no syncope, no orthopnea.  04/07/16-has been doing remarkably well. [place] have any shortness of breath with activity. She is not having any bleeding, no syncope, no chest pain. Tolerating her medications well. She has adequate rate control with her atrial fibrillation.  Wt Readings from Last 3 Encounters:  04/07/16 135 lb (61.2 kg)  10/01/15 135 lb 3.2 oz (61.3 kg)  09/02/15 142 lb (64.4 kg)     Past Medical History:  Diagnosis Date  . A-fib (HCC) 05/14/2013  . History of stroke 05/14/2013  . HTN (hypertension) 05/14/2013  . Hypercholesterolemia   . Hyperglycemia   . Hyperlipemia 05/14/2013  . Hypertension   . Macular degeneration   . Osteoarthritis   . Osteoporosis   . 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 Situs inversus totalis   . Stroke (HCC)   . Vitamin D deficiency     Past Surgical History:  Procedure Laterality Date  . BILATERAL OOPHORECTOMY    .  BUNIONECTOMY Left   . CARDIOVERSION N/A 05/28/2013   Procedure: CARDIOVERSION;  Surgeon: Donato Schultz, MD;  Location: [facility];  Service: Cardiovascular;  Laterality: N/A;  . CATARACT EXTRACTION Bilateral   . CHOLECYSTECTOMY    . TOTAL ABDOMINAL HYSTERECTOMY      Current Outpatient Prescriptions    Medication Sig Dispense Refill  . alendronate (FOSAMAX) 70 MG tablet Take 70 mg by mouth once a week. Take with a full glass of water on an empty stomach.    Marland Kitchen apixaban (ELIQUIS) 2.5 MG TABS tablet Take 2.5 mg by mouth 2 (two) times daily.    Marland Kitchen atenolol (TENORMIN) 50 MG tablet TAKE 1 TABLET DAILY 90 tablet 1  . atorvastatin (LIPITOR) 10 MG tablet Take 10 mg by mouth daily.    . beta carotene w/minerals (OCUVITE) tablet Take 1 tablet by mouth daily.    . Calcium Carbonate-Vitamin D 600-400 MG-UNIT per tablet Take 1 tablet by mouth daily.    Marland Kitchen CARTIA XT 120 MG 24 hr capsule TAKE 1 CAPSULE DAILY 90 capsule 1  . cetirizine (ZYRTEC) 10 MG tablet Take 10 mg by mouth daily.    . ergocalciferol (VITAMIN D2) 50000 UNITS capsule Take 50,000 Units by mouth once a week.    . Multiple Vitamin (MULTI VITAMIN DAILY PO) Take 1 tablet by mouth daily.    . Omega-3 Fatty Acids (FISH OIL) 1000 MG CAPS Take 1 capsule by mouth daily.     No current facility-administered medications for this visit.     Allergies:    Allergies  Allergen Reactions  . Amoxicillin   . Macrodantin [Nitrofurantoin Macrocrystal]     Social History:  The patient  reports that she has quit smoking. She does not have any smokeless tobacco history on file. She reports that she does not drink alcohol or use drugs.  [name] husband.   Family History  Problem Relation Age of Onset  . Heart disease Father     ROS:  Please see the history of present illness.   Denies any fevers, chills, orthopnea, PND, syncope, chest pain. Recent weakness/fatigue. No bleeding.   All other systems reviewed and negative.   PHYSICAL EXAM: VS:  BP 126/78   Pulse 90   Ht 5' 4.5" (1.638 m)   Wt 135 lb (61.2 kg)   BMI 22.81 kg/m  Well nourished, well developed, in no acute distressElderly HEENT: normal, [place]/AT, EOMI Neck: no JVD, normal carotid upstroke, no bruit Cardiac: Irregularly irregular, mildly tachycardic; no murmur Lungs:  Cle/ar to  auscultation bilaterally, no wheezing, rhonchi or rales Abd: soft, nontender, no hepatomegaly, no bruits Ext: no edema, 2+ distal pulses Skin: warm and dry GU: deferred Neuro: no focal abnormalities noted, AAO x 3  EKG:   EKG ordered 10/01/15-atrial fibrillation rate 91 bpm with T-wave inversion inferior laterally, consider ischemia. These T-wave inversions were seen on prior study. Personally viewed. 04/25/14-atrial fibrillation with rapid ventricular response, 126, nonspecific ST-T wave changes consider lateral ischemia, right axis deviation, situs inversus. 05/08/13-atrial fibrillation/flutter heart rate 144 beats per minute with nonspecific ST-T wave changes. Right axis deviation noted. 06/08/13-atrial fibrillation rate 139, dextrocardia. Lab work as above. Prior medical records reviewed.  Echocardiogram: 05/15/13: - Left ventricle: The cavity size was normal. There was mild focal basal hypertrophy of the septum. Systolic function was mildly reduced. The estimated ejection fraction was in the range of 45% to 50%. Wall motion was normal; there were no regional wall motion abnormalities. - Mitral valve: Calcified annulus. Mild regurgitation. -  Right atrium: The atrium was mildly dilated. - Tricuspid valve: Mild-moderate regurgitation. - Pulmonary arteries: PA peak pressure: 40mm Hg (S). - Line: A venous catheter was visualized in the superior vena cava, with its tip in the right atrium. No abnormal features noted. Impressions:  - The right ventricular systolic pressure was increased consistent with mild pulmonary hypertension.  Holter 07/2014 - Mean rate 95bpm. No change in meds.    ASSESSMENT AND PLAN:  1. Atrial fibrillation permanent. We performed a cardioversion which was successful on 05/28/13 however on 06/08/2013, she was once again in atrial fibrillation/flutter at 139 beats per minute. We've discussed the implications, treatment strategies both rhythm/rate control.   I  restarted diltiazem CD 120 mg once a day to further improve rate control as well as increase her atenolol once again to 50 mg. Continue with anticoagulation as already prescribed. I'm using the lower dose because of her age greater than 87 and weight approximately 60 kg. I expressed the importance of anticoagulation in both rate or rhythm control.  She began anticoagulation on April 23, 2013. Cardioversion did not hold. Her daughter, NP at [facility] in neurology was present for prior discussion.  Questions were answered at a prior visit about pacemaker/ablation as well. Holter monitor showed overall average heart rate of 95 bpm. Fairly adequate rate control. Longest pause was 2.7 seconds. No changes made in medications. Asymptomatic.  2. Hypertension-currently well controlled. No longer on furosemide. She states that her leg swelling has been under good control. 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 Hyperlipidemia-atorvastatin. Dr. Pete Glatter monitoring blood work 5. Chronic anticoagaltion-Eliquis 2.5 mg twice a day, age greater than 85, weight approximately 60 kg. No bleeding. No melena 6. We will see back in 12 months.   Signed, Donato Schultz, MD [facility]  04/07/2016 9:11 AM

## 2016-04-16 ENCOUNTER — Other Ambulatory Visit: Payer: Self-pay | Admitting: Cardiology

## 2016-05-31 ENCOUNTER — Other Ambulatory Visit: Payer: Self-pay | Admitting: Cardiology

## 2016-06-02 ENCOUNTER — Other Ambulatory Visit: Payer: Self-pay | Admitting: Cardiology

## 2016-06-02 NOTE — Telephone Encounter (Signed)
atenolol (TENORMIN) 50 MG tablet  Medication  Date: 04/16/2016 [facility] [address] [facility] Ordering/Authorizing: Jake BatheMark C Skains, MD  Order Providers   Prescribing Provider Encounter Provider  Jake BatheMark C Skains, MD Jake BatheMark C Skains, MD  Medication Detail    Disp Refills Start End   atenolol (TENORMIN) 50 MG tablet 90 tablet 3 04/16/2016    Sig: TAKE 1 TABLET DAILY   E-Prescribing Status: Receipt confirmed by pharmacy (04/16/2016 8:43 AM EDT)   Pharmacy   [facility] - [place], [place] - [address]

## 2016-10-13 ENCOUNTER — Other Ambulatory Visit: Payer: Self-pay | Admitting: Geriatric Medicine

## 2016-10-13 DIAGNOSIS — Z1231 Encounter for screening mammogram for malignant neoplasm of breast: Secondary | ICD-10-CM

## 2016-11-22 ENCOUNTER — Ambulatory Visit
Admission: RE | Admit: 2016-11-22 | Discharge: 2016-11-22 | Disposition: A | Discharge status: Home / Self Care | Payer: Medicare Other | Source: Ambulatory Visit | Attending: Geriatric Medicine | Admitting: Geriatric Medicine

## 2016-11-22 DIAGNOSIS — Z1231 Encounter for screening mammogram for malignant neoplasm of breast: Secondary | ICD-10-CM

## 2017-04-27 ENCOUNTER — Ambulatory Visit (INDEPENDENT_AMBULATORY_CARE_PROVIDER_SITE_OTHER): Payer: Medicare Other | Admitting: Cardiology

## 2017-04-27 ENCOUNTER — Encounter: Payer: Self-pay | Admitting: Cardiology

## 2017-04-27 ENCOUNTER — Encounter (INDEPENDENT_AMBULATORY_CARE_PROVIDER_SITE_OTHER): Payer: Self-pay

## 2017-04-27 VITALS — BP 120/80 | HR 94 | Resp 16 | Ht 64.5 in | Wt 133.6 lb

## 2017-04-27 DIAGNOSIS — I10 Essential (primary) hypertension: Secondary | ICD-10-CM

## 2017-04-27 DIAGNOSIS — I482 Chronic atrial fibrillation: Secondary | ICD-10-CM

## 2017-04-27 DIAGNOSIS — E7800 Pure hypercholesterolemia, unspecified: Secondary | ICD-10-CM | POA: Diagnosis not present

## 2017-04-27 DIAGNOSIS — Q240 Dextrocardia: Secondary | ICD-10-CM | POA: Diagnosis not present

## 2017-04-27 DIAGNOSIS — Z7901 Long term (current) use of anticoagulants: Secondary | ICD-10-CM | POA: Diagnosis not present

## 2017-04-27 DIAGNOSIS — I4821 Permanent atrial fibrillation: Secondary | ICD-10-CM

## 2017-04-27 NOTE — Progress Notes (Signed)
[address] [place], [place]  [address] Phone: [phone] Fax:  [phone]  Date:  04/27/2017   ID:  Jill Harvey, DOB 1925-10-15, MRN [id number]  PCP:  Jill Laughter, MD   History of Present Illness: Jill Harvey is a 83 y.o. female with dextrocardia here for followup of permanent atrial fibrillation/atrial flutter.  Has a history of prior stroke.  In summary, Dr. Pete Harvey, her primary physician, had discontinued Aggrenox and started Eliquis. I successfully cardioverted her on 05/28/13 to give her an opportunity of sinus rhythm however on her followup visit on 06/08/13, she was back in atrial fibrillation with rapid ventricular response. Honestly, she still does not feel any different pre-or post cardioversion. We will continue with rate control strategy. Following cardioversion, her heart rate was quite slow in the upper 40s, low 50s so I decreased medication.  On 06/08/13 I decided to place her back on her diltiazem and increase her atenolol back to 50 mg as Dr. Pete Harvey had done previously.  Creatinine is 0.95, hemoglobin 12.7, TSH was normal. EKG 05/08/13 showed atrial/flutter, right axis deviation, may be result of situs inversus, nonspecific ST-T wave changes.  In November 2014 prior to initial cardioversion, patient's daughter noted that she was becoming less able to perform physically she had in the past. More fatigued more quickly with regular activities. She felt lightheaded at times and had symptoms of dizziness but did not have syncope. She does not feel palpitations but she felt nervous in her chest.    07/13/13-overall doing very well, not feeling her atrial fibrillation, no syncope, no orthopnea, no shortness of breath, no chest pain. Tolerating her diltiazem and atenolol well. Her husband is a patient of Dr. Eldridge Harvey.  04/25/14-no real trouble with breathing, syncope, dizziness. She still has increased heart rate.  Checking monitor.  09/02/14 - Holter monitor demonstrated overall average heart rate of 95 bpm with persistent atrial fibrillation. No changes in rate control were made. No symptoms. Doing well.  03/03/15-stable. No bleeding. Doing well. She is very kind to bring cookies. No syncope, no shortness of breath, no chest pain. About to get lab work with Dr. Pete Harvey in November.  10/01/15-overall feeling very well. 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 She's been stable, no falls, no bleeding, no chest pain, no syncope, no orthopnea.  04/07/16-has been doing remarkably well. Went to [employer], did not have any shortness of breath with activity. She is not having any bleeding, no syncope, no chest pain. Tolerating her medications well. She has adequate rate control with her atrial fibrillation.  04/27/17-overall continues to do well.  No syncope orthopnea PND.  No bleeding side effects.  Taking her medications.  She has been feeling well.  Her daughter, Jill Harvey at [facility] is doing well.  Wt Readings from Last 3 Encounters:  04/27/17 133 lb 9.6 oz (60.6 kg)  04/07/16 135 lb (61.2 kg)  10/01/15 135 lb 3.2 oz (61.3 kg)     Past Medical History:  Diagnosis Date  . A-fib (HCC) 05/14/2013  . History of stroke 05/14/2013  . HTN (hypertension) 05/14/2013  . Hypercholesterolemia   . Hyperglycemia   . Hyperlipemia 05/14/2013  . Hypertension   . Macular degeneration   . Osteoarthritis   . Osteoporosis   . 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 Situs inversus totalis   . Stroke (HCC)   . Vitamin D deficiency  Past Surgical History:  Procedure Laterality Date  . BILATERAL OOPHORECTOMY    . BREAST BIOPSY Right 10/02/2013  . BREAST EXCISIONAL BIOPSY Right 1959  . BUNIONECTOMY Left   . CATARACT EXTRACTION Bilateral   . CHOLECYSTECTOMY    . TOTAL ABDOMINAL HYSTERECTOMY      Current Outpatient Medications  Medication Sig Dispense Refill  . alendronate  (FOSAMAX) 70 MG tablet Take 70 mg by mouth once a week. Take with a full glass of water on an empty stomach.    Marland Kitchen. apixaban (ELIQUIS) 2.5 MG TABS tablet Take 2.5 mg by mouth 2 (two) times daily.    Marland Kitchen. atenolol (TENORMIN) 50 MG tablet TAKE 1 TABLET DAILY 90 tablet 3  . atorvastatin (LIPITOR) 10 MG tablet Take 10 mg by mouth daily.    . beta carotene w/minerals (OCUVITE) tablet Take 1 tablet by mouth daily.    . Calcium Carbonate-Vitamin D 600-400 MG-UNIT per tablet Take 1 tablet by mouth daily.    Marland Kitchen. CARTIA XT 120 MG 24 hr capsule TAKE 1 CAPSULE DAILY 90 capsule 1  . cetirizine (ZYRTEC) 10 MG tablet Take 10 mg by mouth daily.    . ergocalciferol (VITAMIN D2) 50000 UNITS capsule Take 50,000 Units by mouth once a week.    . Multiple Vitamin (MULTI VITAMIN DAILY PO) Take 1 tablet by mouth daily.    . Omega-3 Fatty Acids (FISH OIL) 1000 MG CAPS Take 1 capsule by mouth daily.     No current facility-administered medications for this visit.     Allergies:    Allergies  Allergen Reactions  . Amoxicillin   . Macrodantin [Nitrofurantoin Macrocrystal]     Social History:  The patient  reports that she has quit smoking. she has never used smokeless tobacco. She reports that she does not drink alcohol or use drugs.  Jill Harvey husband.   Family History  Problem Relation Age of Onset  . Heart disease Father     ROS:  Please see the history of present illness.    All other systems reviewed and negative.   PHYSICAL EXAM: VS:  BP 120/80   Pulse 94   Resp 16   Ht 5' 4.5" (1.638 m)   Wt 133 lb 9.6 oz (60.6 kg)   SpO2 97%   BMI 22.58 kg/m   GEN: Well nourished, well developed, in no acute distress  HEENT: normal  Neck: no JVD, carotid bruits, or masses Cardiac: irreg ; no murmurs, rubs, or gallops,no edema  Respiratory:  clear to auscultation bilaterally, normal work of breathing GI: soft, nontender, nondistended, + BS MS: no deformity or atrophy  Skin: warm and dry, no rash Neuro:  Alert and  Oriented x 3, Strength and sensation are intact Psych: euthymic mood, full affect  EKG:   EKG ordered 10/01/15-atrial fibrillation rate 91 bpm with T-wave inversion inferior laterally, consider ischemia. These T-wave inversions were seen on prior study. Personally viewed. 04/25/14-atrial fibrillation with rapid ventricular response, 126, nonspecific ST-T wave changes consider lateral ischemia, right axis deviation, situs inversus. 05/08/13-atrial fibrillation/flutter heart rate 144 beats per minute with nonspecific ST-T wave changes. Right axis deviation noted. 06/08/13-atrial fibrillation rate 139, dextrocardia. Lab work as above. Prior medical records reviewed.  Echocardiogram: 05/15/13: - Left ventricle: The cavity size was normal. There was mild focal basal hypertrophy of the septum. Systolic function was mildly reduced. The estimated ejection fraction was in the range of 45% to 50%. Wall motion was normal; there were no regional wall motion abnormalities. -  Mitral valve: Calcified annulus. Mild regurgitation. - Right atrium: The atrium was mildly dilated. - Tricuspid valve: Mild-moderate regurgitation. - Pulmonary arteries: PA peak pressure: 40mm Hg (S). - Line: A venous catheter was visualized in the superior vena cava, with its tip in the right atrium. No abnormal features noted. Impressions:  - The right ventricular systolic pressure was increased consistent with mild pulmonary hypertension.  Holter 07/2014 - Mean rate 95bpm. No change in meds. Longest pause was 2.7 seconds.   ASSESSMENT AND PLAN:  1. Atrial fibrillation permanent. We performed a cardioversion which was successful on 05/28/13 however on 06/08/2013, she was once again in atrial fibrillation/flutter at 139 beats per minute. We've discussed the implications, treatment strategies both rhythm/rate control.   I restarted diltiazem CD 120 mg once a day to further improve rate control as well as increase her atenolol  once again to 50 mg. Continue with anticoagulation as already prescribed. I'm using the lower dose because of her age greater than 87 and weight approximately 60 kg. I expressed the importance of anticoagulation in both rate or rhythm control.  She began anticoagulation on April 23, 2013. Cardioversion did not hold. [facility], [facility] was present for prior discussion.  Questions were answered at a prior visit about pacemaker/ablation as well. Holter monitor showed overall average heart rate of 95 bpm. Fairly adequate rate control. Longest pause was 2.7 seconds. No changes made in medications. Asymptomatic.  Overall I am pleased with how things are going.  No changes in medications. 2. Hypertension-currently well controlled. No longer on furosemide. She states that her leg swelling has been under good control.  No changes.  Doing well. 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 Hyperlipidemia-atorvastatin. Dr. Pete GlatterStoneking monitoring blood work LDL 71.  Excellent. 5. Chronic anticoagaltion-Eliquis 2.5 mg twice a day, age greater than 80, weight approximately 60 kg. No bleeding. No melena.  Doing very well, stable. 6. We will see back in 12 months.   Signed, Donato SchultzMark Jaelon Gatley, MD [facility]  04/27/2017 9:34 AM

## 2017-04-27 NOTE — Patient Instructions (Signed)

## 2017-10-17 ENCOUNTER — Other Ambulatory Visit: Payer: Self-pay | Admitting: Geriatric Medicine

## 2017-10-17 DIAGNOSIS — Z1231 Encounter for screening mammogram for malignant neoplasm of breast: Secondary | ICD-10-CM

## 2017-11-23 ENCOUNTER — Ambulatory Visit: Payer: Medicare Other

## 2018-04-24 ENCOUNTER — Encounter: Payer: Self-pay | Admitting: Cardiology

## 2018-05-15 ENCOUNTER — Ambulatory Visit (INDEPENDENT_AMBULATORY_CARE_PROVIDER_SITE_OTHER): Payer: Medicare Other | Admitting: Cardiology

## 2018-05-15 ENCOUNTER — Encounter: Payer: Self-pay | Admitting: Cardiology

## 2018-05-15 VITALS — BP 138/80 | HR 98 | Ht 64.5 in | Wt 127.8 lb

## 2018-05-15 DIAGNOSIS — I4821 Permanent atrial fibrillation: Secondary | ICD-10-CM | POA: Diagnosis not present

## 2018-05-15 DIAGNOSIS — I10 Essential (primary) hypertension: Secondary | ICD-10-CM

## 2018-05-15 DIAGNOSIS — Z7901 Long term (current) use of anticoagulants: Secondary | ICD-10-CM

## 2018-05-15 DIAGNOSIS — Q240 Dextrocardia: Secondary | ICD-10-CM

## 2018-05-15 NOTE — Patient Instructions (Signed)
Medication Instructions:  The current medical regimen is effective;  continue present plan and medications.  If you need a refill on your cardiac medications before your next appointment, please call your pharmacy.   Follow-Up: At [facility], you and your health needs are our priority.  As part of our continuing mission to provide you with exceptional heart care, we have created designated Provider Care Teams.  These Care Teams include your primary Cardiologist (physician) and Advanced Practice Providers (APPs -  Physician Assistants and Nurse Practitioners) who all work together to provide you with the care you need, when you need it. You will need a follow up appointment in 12 months.  Please call our office 2 months in advance to schedule this appointment.  You may see Mark Skains, MD or one of the following Advanced Practice Providers on your designated Care Team:   Lori Gerhardt, NP Laura Ingold, NP . Jill McDaniel, NP   

## 2018-05-15 NOTE — Progress Notes (Signed)
Cardiology Office Note:    Date:  05/15/2018   ID:  Jill Harvey, DOB 11-26-1925, MRN [id number]  PCP:  Merlene Laughter, MD  Cardiologist:  Donato Schultz, MD  Electrophysiologist:  None   Referring MD: Merlene Laughter, MD     History of Present Illness:    Jill Harvey is a 84 y.o. female here for dextrocardia, permanent atrial fibrillation follow-up.  Has a prior history of stroke. - Sees Dr. Pete Glatter.  She went through cardioversion but subsequently had return of atrial fibrillation and was quite asymptomatic with it.  Her daughter, Financial controller, Publishing rights manager at [employer].  She was here for this visit.  She states that they have been doing research on brain stimulators for Parkinson's.  Overall, she has been feeling quite well.  She still maintaining her activity.  Unfortunately her husband has Alzheimer's and this is stressful for the family.  She has stopped her atorvastatin to simplify her medication regimen.  No chest pain, no shortness of breath, no syncope.  Echocardiogram 05/15/2013: EF 45 to 50% mild to moderate tricuspid regurgitation, PA pressures 40 mmHg.  Holter monitor 07/2014- longest pause 2.7 seconds mean rate was 95 bpm.  Past Medical History:  Diagnosis Date  . A-fib (HCC) 05/14/2013  . History of stroke 05/14/2013  . HTN (hypertension) 05/14/2013  . Hypercholesterolemia   . Hyperglycemia   . Hyperlipemia 05/14/2013  . Hypertension   . Macular degeneration   . Osteoarthritis   . Osteoporosis   . 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 Situs inversus totalis   . Stroke (HCC)   . Vitamin D deficiency     Past Surgical History:  Procedure Laterality Date  . BILATERAL OOPHORECTOMY    . BREAST BIOPSY Right 10/02/2013  . BREAST EXCISIONAL BIOPSY Right 1959  . BUNIONECTOMY Left   . CARDIOVERSION N/A 05/28/2013   Procedure: CARDIOVERSION;  Surgeon: Donato Schultz, MD;  Location: [facility] ENDOSCOPY;  Service: Cardiovascular;  Laterality: N/A;  . CATARACT EXTRACTION Bilateral     . CHOLECYSTECTOMY    . TOTAL ABDOMINAL HYSTERECTOMY      Current Medications: Current Meds  Medication Sig  . apixaban (ELIQUIS) 2.5 MG TABS tablet Take 2.5 mg by mouth 2 (two) times daily.  Marland Kitchen atenolol (TENORMIN) 50 MG tablet TAKE 1 TABLET DAILY  . beta carotene w/minerals (OCUVITE) tablet Take 1 tablet by mouth daily.  Marland Kitchen CARTIA XT 120 MG 24 hr capsule TAKE 1 CAPSULE DAILY  . cetirizine (ZYRTEC) 10 MG tablet Take 10 mg by mouth daily.  . Multiple Vitamin (MULTI VITAMIN DAILY PO) Take 1 tablet by mouth daily.     Allergies:   Amoxicillin and Macrodantin [nitrofurantoin macrocrystal]   Social History   Socioeconomic History  . Marital status: Married    Spouse name: Not on file  . Number of children: Not on file  . Years of education: Not on file  . Highest education level: Not on file  Occupational History  . Not on file  Social Needs  . Financial resource strain: Not on file  . Food insecurity:    Worry: Not on file    Inability: Not on file  . Transportation needs:    Medical: Not on file    Non-medical: Not on file  Tobacco Use  . Smoking status: Former Games developer  . Smokeless tobacco: Never Used  . Tobacco comment: QUIT ABOUT 60YRS AGO  Substance and Sexual Activity  . Alcohol use: No  . Drug use: No  . Sexual  activity: Not on file  Lifestyle  . Physical activity:    Days per week: Not on file    Minutes per session: Not on file  . Stress: Not on file  Relationships  . Social connections:    Talks on phone: Not on file    Gets together: Not on file    Attends religious service: Not on file    Active member of club or organization: Not on file    Attends meetings of clubs or organizations: Not on file    Relationship status: Not on file  Other Topics Concern  . Not on file  Social History Narrative  . Not on file     Family History: The patient's family history includes Heart disease in her father.  ROS:   Please see the history of present illness.    Denies any fevers chills nausea vomiting syncope bleeding.  Positive for trouble with her vision.  All other systems reviewed and are negative.  EKGs/Labs/Other Studies Reviewed:    The following studies were reviewed today: Prior EKG lab work office notes  EKG:  EKG is  ordered today.  The ekg ordered today demonstrates atrial fibrillation heart rate 98 bpm, normal R wave progression when leads placed right-sided precordial.  Dextrocardia.  Personally reviewed and interpreted  Recent Labs: No results found for requested labs within last 8760 hours.  Recent Lipid Panel No results found for: CHOL, TRIG, HDL, CHOLHDL, VLDL, LDLCALC, LDLDIRECT  Physical Exam:    VS:  BP 138/80   Pulse 98   Ht 5' 4.5" (1.638 m)   Wt 127 lb 12.8 oz (58 kg)   BMI 21.60 kg/m     Wt Readings from Last 3 Encounters:  05/15/18 127 lb 12.8 oz (58 kg)  04/27/17 133 lb 9.6 oz (60.6 kg)  04/07/16 135 lb (61.2 kg)     GEN: Elderly well nourished, well developed in no acute distress HEENT: Normal NECK: No JVD; No carotid bruits LYMPHATICS: No lymphadenopathy CARDIAC: Irregularly irregular normal rate, no murmurs, rubs, gallops RESPIRATORY:  Clear to auscultation without rales, wheezing or rhonchi  ABDOMEN: Soft, non-tender, non-distended MUSCULOSKELETAL:  No edema; No deformity  SKIN: Warm and dry NEUROLOGIC:  Alert and oriented x 3 PSYCHIATRIC:  Normal affect   ASSESSMENT:    1. Permanent atrial fibrillation   2. Dextrocardia   3. Essential hypertension   4. Chronic anticoagulation    PLAN:    In order of problems listed above:  Dextrocardia - We reversed the leads when taking her EKG.  Permanent atrial fibrillation - Shortly after cardioversion in 2014, she went back to atrial fibrillation but we resumed with rate control.  Essential hypertension - No changes doing well.  Chronic anticoagulation - Eliquis 2.5 twice a day, no bleeding, hemoglobin 13.4 in August 2019.  Creatinine 1.0  2019.  They decided to stop her low-dose atorvastatin to simplify her medication regimen.  This is fine.  Medication Adjustments/Labs and Tests Ordered: Current medicines are reviewed at length with the patient today.  Concerns regarding medicines are outlined above.  Orders Placed This Encounter  Procedures  . EKG 12-Lead   No orders of the defined types were placed in this encounter.   Patient Instructions  Medication Instructions:  The current medical regimen is effective;  continue present plan and medications.  If you need a refill on your cardiac medications before your next appointment, please call your pharmacy.   Follow-Up: At [facility], you and your  health needs are our priority.  As part of our continuing mission to provide you with exceptional heart care, we have created designated Provider Care Teams.  These Care Teams include your primary Cardiologist (physician) and Advanced Practice Providers (APPs -  Physician Assistants and Nurse Practitioners) who all work together to provide you with the care you need, when you need it. You will need a follow up appointment in 12 months.  Please call our office 2 months in advance to schedule this appointment.  You may see Donato Schultz, MD or one of the following Advanced Practice Providers on your designated Care Team:   Norma Fredrickson, NP Nada Boozer, NP . Georgie Chard, NP      Signed, Donato Schultz, MD  05/15/2018 11:29 AM    [facility]

## 2019-10-12 ENCOUNTER — Ambulatory Visit: Payer: Medicare Other | Admitting: Cardiology

## 2020-08-26 ENCOUNTER — Ambulatory Visit (INDEPENDENT_AMBULATORY_CARE_PROVIDER_SITE_OTHER): Payer: Medicare Other | Admitting: Podiatry

## 2020-08-26 ENCOUNTER — Encounter: Payer: Self-pay | Admitting: Podiatry

## 2020-08-26 ENCOUNTER — Other Ambulatory Visit: Payer: Self-pay

## 2020-08-26 DIAGNOSIS — M79675 Pain in left toe(s): Secondary | ICD-10-CM | POA: Diagnosis not present

## 2020-08-26 DIAGNOSIS — M79674 Pain in right toe(s): Secondary | ICD-10-CM

## 2020-08-26 DIAGNOSIS — Z7901 Long term (current) use of anticoagulants: Secondary | ICD-10-CM

## 2020-08-26 DIAGNOSIS — M2041 Other hammer toe(s) (acquired), right foot: Secondary | ICD-10-CM | POA: Diagnosis not present

## 2020-08-26 DIAGNOSIS — B351 Tinea unguium: Secondary | ICD-10-CM

## 2020-08-26 DIAGNOSIS — M2042 Other hammer toe(s) (acquired), left foot: Secondary | ICD-10-CM

## 2020-09-02 NOTE — Progress Notes (Signed)
Subjective:   Patient ID: Jill Harvey, female   DOB: 87 y.o.   MRN: [id number]   HPI 87 year old female presents the office today for concerns of thick, elongated toenails that she cannot trim her self and also the toes rub together causing irritation and corns.  She keeps bandages on both the toes.  Denies any open lesion that she reports.  With the toes rub as well because of discomfort.  Denies any redness or drainage or any swelling.  She has no other concerns today.   Review of Systems  All other systems reviewed and are negative.  Past Medical History:  Diagnosis Date  . A-fib (HCC) 05/14/2013  . History of stroke 05/14/2013  . HTN (hypertension) 05/14/2013  . Hypercholesterolemia   . Hyperglycemia   . Hyperlipemia 05/14/2013  . Hypertension   . Macular degeneration   . Osteoarthritis   . Osteoporosis   . 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 Situs inversus totalis   . Stroke (HCC)   . Vitamin D deficiency     Past Surgical History:  Procedure Laterality Date  . BILATERAL OOPHORECTOMY    . BREAST BIOPSY Right 10/02/2013  . BREAST EXCISIONAL BIOPSY Right 1959  . BUNIONECTOMY Left   . CARDIOVERSION N/A 05/28/2013   Procedure: CARDIOVERSION;  Surgeon: Donato Schultz, MD;  Location: [facility] ENDOSCOPY;  Service: Cardiovascular;  Laterality: N/A;  . CATARACT EXTRACTION Bilateral   . CHOLECYSTECTOMY    . TOTAL ABDOMINAL HYSTERECTOMY       Current Outpatient Medications:  .  diltiazem (TIAZAC) 120 MG 24 hr capsule, Take 1 capsule by mouth daily., Disp: , Rfl:  .  apixaban (ELIQUIS) 2.5 MG TABS tablet, Take 2.5 mg by mouth 2 (two) times daily., Disp: , Rfl:  .  atenolol (TENORMIN) 50 MG tablet, TAKE 1 TABLET DAILY, Disp: 90 tablet, Rfl: 3 .  beta carotene w/minerals (OCUVITE) tablet, Take 1 tablet by mouth daily., Disp: , Rfl:  .  CARTIA XT 120 MG 24 hr capsule, TAKE 1 CAPSULE DAILY, Disp: 90 capsule, Rfl: 1 .  cetirizine (ZYRTEC) 10 MG tablet, Take 10 mg by mouth daily., Disp: , Rfl:   .  Multiple Vitamin (MULTI VITAMIN DAILY PO), Take 1 tablet by mouth daily., Disp: , Rfl:  .  Omega-3 Fatty Acids (FISH OIL) 1000 MG CAPS, Take by mouth., Disp: , Rfl:   Allergies  Allergen Reactions  . Amoxicillin   . Macrodantin [Nitrofurantoin Macrocrystal]           Objective:  Physical Exam  General: AAO x3  Dermatological: Nails are hypertrophic, dystrophic, brittle, discolored, elongated 10. No surrounding redness or drainage. Tenderness nails 1-5 bilaterally.  Superficial area skin breakdown in between the toes where she has "corns" but there is no significant hyperkeratotic tissue today.  I also this is where she keeps bandages in the toes daily.  No redness or drainage or any swelling.  No open lesions or pre-ulcerative lesions are identified today.   Vascular: Dorsalis Pedis artery and Posterior Tibial artery pedal pulses are 2/4 bilateral with immedate capillary fill time. There is no pain with calf compression, swelling, warmth, erythema.   Neruologic: Grossly intact via light touch bilateral.   Musculoskeletal: Hammertoes are present.  Muscular strength 5/5 in all groups tested bilateral.   Assessment:   87 year old female with symptomatic onychomycosis, digital deformity    Plan:  -Treatment options discussed including all alternatives, risks, and complications -Etiology of symptoms were discussed -Nails sharply debrided x10 without any  complications or bleeding -Dispensed toe separators to avoid any bandages on the toes.  Continue offloading and monitoring signs or signs of infection.  There are superficial area skin breakdown although very minimal at this is where the bandages causing skin irritation.  Hold off on Band-Aids.  Vivi Barrack DPM

## 2020-10-13 ENCOUNTER — Ambulatory Visit (INDEPENDENT_AMBULATORY_CARE_PROVIDER_SITE_OTHER): Payer: Medicare Other | Admitting: Podiatry

## 2020-10-13 ENCOUNTER — Other Ambulatory Visit: Payer: Self-pay

## 2020-10-13 ENCOUNTER — Encounter: Payer: Self-pay | Admitting: Podiatry

## 2020-10-13 DIAGNOSIS — B351 Tinea unguium: Secondary | ICD-10-CM | POA: Diagnosis not present

## 2020-10-13 DIAGNOSIS — L84 Corns and callosities: Secondary | ICD-10-CM | POA: Diagnosis not present

## 2020-10-13 DIAGNOSIS — S91312A Laceration without foreign body, left foot, initial encounter: Secondary | ICD-10-CM

## 2020-10-13 DIAGNOSIS — M79675 Pain in left toe(s): Secondary | ICD-10-CM | POA: Diagnosis not present

## 2020-10-13 DIAGNOSIS — M79674 Pain in right toe(s): Secondary | ICD-10-CM | POA: Diagnosis not present

## 2020-10-13 DIAGNOSIS — M2042 Other hammer toe(s) (acquired), left foot: Secondary | ICD-10-CM

## 2020-10-13 DIAGNOSIS — M2041 Other hammer toe(s) (acquired), right foot: Secondary | ICD-10-CM

## 2020-10-15 NOTE — Progress Notes (Signed)
Subjective:  Patient ID: Jill Harvey, female    DOB: April 21, 1926,  MRN: [id number]  Jill Harvey presents to clinic today for at risk foot care with h/o clotting disorder and corn(s) left 2nd toe , callus(es) left foot and painful mycotic nails.  Pain interferes with ambulation. Aggravating factors include wearing enclosed shoe gear. Painful toenails interfere with ambulation. Aggravating factors include wearing enclosed shoe gear. Pain is relieved with periodic professional debridement. Painful corns and calluses are aggravated when weightbearing with and without shoegear. Pain is relieved with periodic professional debridement.   Daughter states she is aware insurance will not cover today's visit due to frequency.  She has a small piece of skin on the bottom of her left foot and states she was afraid to remove it. She is unsure when it appeared and relates no new episodes of trauma to her left foot. She denies any pain, redness, drainage or swelling from area. She is able to weightbear without any problems.   Current Outpatient Medications:  .  apixaban (ELIQUIS) 2.5 MG TABS tablet, Take 2.5 mg by mouth 2 (two) times daily., Disp: , Rfl:  .  atenolol (TENORMIN) 50 MG tablet, TAKE 1 TABLET DAILY, Disp: 90 tablet, Rfl: 3 .  beta carotene w/minerals (OCUVITE) tablet, Take 1 tablet by mouth daily., Disp: , Rfl:  .  CARTIA XT 120 MG 24 hr capsule, TAKE 1 CAPSULE DAILY, Disp: 90 capsule, Rfl: 1 .  cetirizine (ZYRTEC) 10 MG tablet, Take 10 mg by mouth daily., Disp: , Rfl:  .  diltiazem (TIAZAC) 120 MG 24 hr capsule, Take 1 capsule by mouth daily., Disp: , Rfl:  .  Multiple Vitamin (MULTI VITAMIN DAILY PO), Take 1 tablet by mouth daily., Disp: , Rfl:  .  Omega-3 Fatty Acids (FISH OIL) 1000 MG CAPS, Take by mouth., Disp: , Rfl:   Allergies  Allergen Reactions  . Amoxicillin   . Macrodantin [Nitrofurantoin Macrocrystal]     Review of Systems: Negative except as noted in the  HPI. Objective:   Constitutional Jill Harvey is a pleasant 87 y.o. Caucasian female, WD, WN in NAD. AAO x 3.   Vascular Capillary refill time to digits immediate b/l. Palpable pedal pulses b/l LE. Pedal hair absent. Lower extremity skin temperature gradient within normal limits. No pain with calf compression b/l. No cyanosis or clubbing noted.  Neurologic Normal speech. Oriented to person, place, and time. Protective sensation intact 5/5 intact bilaterally with 10g monofilament b/l.  Dermatologic Pedal skin with normal turgor, texture and tone bilaterally. No open wounds bilaterally. No interdigital macerations bilaterally. Toenails 1-5 b/l elongated, discolored, dystrophic, thickened, crumbly with subungual debris and tenderness to dorsal palpation. Hyperkeratotic lesion(s) L 2nd toe and 1st metatarsal head left foot.  No erythema, no edema, no drainage, no fluctuance. Small skin tear plantar aspect of left foot submet head 1. No signs of infection. No open wound.  Orthopedic: Normal muscle strength 5/5 to all lower extremity muscle groups bilaterally. No pain crepitus or joint limitation noted with ROM b/l. Hammertoes noted to the 2-5 bilaterally.   Radiographs: None Assessment:   1. Pain due to onychomycosis of toenails of both feet   2. Corns and callosities   3. Pain in toes of both feet   4. Tear of skin of plantar aspect of left foot, initial encounter   5. Hammertoes of both feet    Plan:  Patient was evaluated and treated and all questions answered.  Onychomycosis with pain -Nails  palliatively debridement as below -Educated on self-care  Procedure: Nail Debridement Rationale: Pain Type of Debridement: manual, sharp debridement. Instrumentation: Nail nipper, rotary burr. Number of Nails: 10 -Examined patient. -Small piece of hanging skin removed gently with tissue nipper. Triple antibiotic ointment applied to area. No further treatment required by patient. -Patient to  continue soft, supportive shoe gear daily. -Toenails 1-5 b/l were debrided in length and girth with sterile nail nippers and dremel without iatrogenic bleeding.  -Corn(s) L 2nd toe and callus(es) 1st metatarsal head left foot were pared utilizing sterile scalpel blade without incident. Total number debrided =2. -Patient to report any pedal injuries to medical professional immediately. -Patient/POA to call should there be question/concern in the interim.  Return in about 3 months (around 01/13/2021).  Freddie Breech, DPM

## 2021-01-19 ENCOUNTER — Ambulatory Visit (INDEPENDENT_AMBULATORY_CARE_PROVIDER_SITE_OTHER): Payer: Medicare Other | Admitting: Podiatry

## 2021-01-19 ENCOUNTER — Encounter: Payer: Self-pay | Admitting: Podiatry

## 2021-01-19 ENCOUNTER — Telehealth: Payer: Self-pay | Admitting: *Deleted

## 2021-01-19 ENCOUNTER — Other Ambulatory Visit: Payer: Self-pay

## 2021-01-19 DIAGNOSIS — L84 Corns and callosities: Secondary | ICD-10-CM | POA: Diagnosis not present

## 2021-01-19 DIAGNOSIS — B351 Tinea unguium: Secondary | ICD-10-CM

## 2021-01-19 DIAGNOSIS — M79674 Pain in right toe(s): Secondary | ICD-10-CM | POA: Diagnosis not present

## 2021-01-19 DIAGNOSIS — Q828 Other specified congenital malformations of skin: Secondary | ICD-10-CM

## 2021-01-19 DIAGNOSIS — M79675 Pain in left toe(s): Secondary | ICD-10-CM

## 2021-01-19 DIAGNOSIS — D689 Coagulation defect, unspecified: Secondary | ICD-10-CM

## 2021-01-19 NOTE — Telephone Encounter (Signed)
Patient's family member called and said that she took a test and tested positive for covid after her appointment this morning.

## 2021-01-21 NOTE — Progress Notes (Signed)
  Subjective:  Patient ID: Jill Harvey, female    DOB: Jul 15, 1925,  MRN: [id number]  CELEST REITZ presents to clinic today for at risk foot care with h/o clotting disorder and painful porokeratotic lesion(s) left foot and painful mycotic toenails that limit ambulation. Painful toenails interfere with ambulation. Aggravating factors include wearing enclosed shoe gear. Pain is relieved with periodic professional debridement. Painful porokeratotic lesions are aggravated when weightbearing with and without shoegear. Pain is relieved with periodic professional debridement.  Her daughter is present during today's visit.   They voice no new pedal problems on today's visit.  PCP is Merlene Laughter, MD , and last visit was February, 2022. She is seen every six months. Has appt this month.  Allergies  Allergen Reactions   Amoxicillin    Macrodantin [Nitrofurantoin Macrocrystal]     Review of Systems: Negative except as noted in the HPI. Objective:   Constitutional [place] B Veldman is a pleasant 87 y.o. Caucasian female, WD, WN in NAD. AAO x 3.   Vascular Capillary refill time to digits immediate b/l. Palpable DP pulse(s) b/l lower extremities Palpable PT pulse(s) b/l lower extremities Pedal hair absent. Lower extremity skin temperature gradient within normal limits. No pain with calf compression b/l. No cyanosis or clubbing noted.  Neurologic Normal speech. Oriented to person, place, and time. Protective sensation intact 5/5 intact bilaterally with 10g monofilament b/l. Proprioception intact bilaterally.  Dermatologic Pedal skin with normal turgor, texture and tone b/l lower extremities. No open wounds b/l lower extremities. No interdigital macerations b/l lower extremities. Toenails 1-5 b/l elongated, discolored, dystrophic, thickened, crumbly with subungual debris and tenderness to dorsal palpation. Hyperkeratotic lesion(s) L 2nd toe.  No erythema, no edema, no drainage, no fluctuance.  Porokeratotic lesion(s) submet head 1 left foot and 1st metatarsal head left foot. No erythema, no edema, no drainage, no fluctuance.  Orthopedic: Normal muscle strength 5/5 to all lower extremity muscle groups bilaterally. No pain crepitus or joint limitation noted with ROM b/l lower extremities. Hammertoe(s) noted to the 2-5 bilaterally.   Radiographs: None Assessment:   1. Pain due to onychomycosis of toenails of both feet   2. Corns   3. Porokeratosis   4. Coagulation defect (HCC)    Plan:  -Examined patient. -Patient to continue soft, supportive shoe gear daily. -Toenails 1-5 b/l were debrided in length and girth with sterile nail nippers and dremel without iatrogenic bleeding.  -Corn(s) L 2nd toe pared utilizing sterile scalpel blade without complication or incident. Total number debrided=1. -Painful porokeratotic lesion(s) submet head 1 left foot and 1st metatarsal head left foot pared and enucleated with sterile scalpel blade without incident. Total number of lesions debrided=2. -Patient to report any pedal injuries to medical professional immediately. -Patient/POA to call should there be question/concern in the interim.  Return in about 9 weeks (around 03/23/2021).  Freddie Breech, DPM

## 2021-03-25 ENCOUNTER — Encounter: Payer: Self-pay | Admitting: Podiatry

## 2021-03-25 ENCOUNTER — Ambulatory Visit (INDEPENDENT_AMBULATORY_CARE_PROVIDER_SITE_OTHER): Payer: Medicare Other | Admitting: Podiatry

## 2021-03-25 ENCOUNTER — Other Ambulatory Visit: Payer: Self-pay

## 2021-03-25 DIAGNOSIS — B351 Tinea unguium: Secondary | ICD-10-CM

## 2021-03-25 DIAGNOSIS — Q828 Other specified congenital malformations of skin: Secondary | ICD-10-CM | POA: Diagnosis not present

## 2021-03-25 DIAGNOSIS — M79674 Pain in right toe(s): Secondary | ICD-10-CM

## 2021-03-25 DIAGNOSIS — D689 Coagulation defect, unspecified: Secondary | ICD-10-CM | POA: Diagnosis not present

## 2021-03-25 DIAGNOSIS — M79675 Pain in left toe(s): Secondary | ICD-10-CM | POA: Diagnosis not present

## 2021-03-28 NOTE — Progress Notes (Signed)
  Subjective:  Patient ID: Jill Harvey, female    DOB: [birth year],  MRN: [id number]  FRANCHELLE FOSKETT presents to clinic today for at risk foot care with h/o clotting disorder and painful porokeratotic lesion(s) left foot and painful mycotic toenails that limit ambulation. Painful toenails interfere with ambulation. Aggravating factors include wearing enclosed shoe gear. Pain is relieved with periodic professional debridement. Painful porokeratotic lesions are aggravated when weightbearing with and without shoegear. Pain is relieved with periodic professional debridement.  Her daughter is present during today's visit.   PCP is Merlene Laughter, MD , and last visit was February, 2022.   Allergies  Allergen Reactions   Amoxicillin    Macrodantin [Nitrofurantoin Macrocrystal]     Review of Systems: Negative except as noted in the HPI. Objective:   Constitutional [place] B Lovins is a pleasant 87 y.o. Caucasian female, WD, WN in NAD. AAO x 3.   Vascular Capillary refill time to digits immediate b/l. Palpable DP pulse(s) b/l lower extremities Palpable PT pulse(s) b/l lower extremities Pedal hair absent. Lower extremity skin temperature gradient within normal limits. No pain with calf compression b/l. No cyanosis or clubbing noted.  Neurologic Normal speech. Oriented to person, place, and time. Protective sensation intact 5/5 intact bilaterally with 10g monofilament b/l. Proprioception intact bilaterally.  Dermatologic Pedal skin with normal turgor, texture and tone b/l lower extremities. No open wounds b/l lower extremities. No interdigital macerations b/l lower extremities. Toenails 1-5 b/l elongated, discolored, dystrophic, thickened, crumbly with subungual debris and tenderness to dorsal palpation. Porokeratotic lesion(s) submet head 1 left foot and 1st metatarsal head left foot. No erythema, no edema, no drainage, no fluctuance.  Orthopedic: Normal muscle strength 5/5 to all lower  extremity muscle groups bilaterally. No pain crepitus or joint limitation noted with ROM b/l lower extremities. Hammertoe(s) noted to the 2-5 bilaterally.   Radiographs: None Assessment:   1. Pain due to onychomycosis of toenails of both feet   2. Porokeratosis   3. Coagulation defect (HCC)    Plan:  -Examined patient. -Patient to continue soft, supportive shoe gear daily. -Toenails 1-5 b/l were debrided in length and girth with sterile nail nippers and dremel without iatrogenic bleeding.  -Painful porokeratotic lesion(s) submet head 1 left foot and 1st metatarsal head left foot pared and enucleated with sterile scalpel blade without incident. Total number of lesions debrided=2. -Patient to report any pedal injuries to medical professional immediately. -Patient/POA to call should there be question/concern in the interim.  Return in about 9 weeks (around 05/27/2021). She would like to keep her appointments at 9 weeks. She will see Dr.Sikora in 9 weeks and see me again in 18 weeks.  Freddie Breech, DPM

## 2021-05-27 ENCOUNTER — Ambulatory Visit (INDEPENDENT_AMBULATORY_CARE_PROVIDER_SITE_OTHER): Payer: Medicare Other | Admitting: Podiatry

## 2021-05-27 ENCOUNTER — Encounter: Payer: Self-pay | Admitting: Podiatry

## 2021-05-27 ENCOUNTER — Other Ambulatory Visit: Payer: Self-pay

## 2021-05-27 DIAGNOSIS — B351 Tinea unguium: Secondary | ICD-10-CM | POA: Diagnosis not present

## 2021-05-27 DIAGNOSIS — Q828 Other specified congenital malformations of skin: Secondary | ICD-10-CM | POA: Diagnosis not present

## 2021-05-27 DIAGNOSIS — M79674 Pain in right toe(s): Secondary | ICD-10-CM

## 2021-05-27 DIAGNOSIS — M79675 Pain in left toe(s): Secondary | ICD-10-CM

## 2021-05-27 DIAGNOSIS — D689 Coagulation defect, unspecified: Secondary | ICD-10-CM

## 2021-05-27 NOTE — Progress Notes (Signed)
°  Subjective:  Patient ID: Jill Harvey, female    DOB: 04-Aug-1925,  MRN: [id number]  Jill Harvey presents to clinic today for at risk foot care with h/o clotting disorder and painful porokeratotic lesion(s) left foot and painful mycotic toenails that limit ambulation. Painful toenails interfere with ambulation. Aggravating factors include wearing enclosed shoe gear. Pain is relieved with periodic professional debridement. Painful porokeratotic lesions are aggravated when weightbearing with and without shoegear. Pain is relieved with periodic professional debridement.  Her daughter is present during today's visit.   PCP is Merlene Laughter, MD , and last visit was February, 2022.   Allergies  Allergen Reactions   Amoxicillin    Macrodantin [Nitrofurantoin Macrocrystal]     Review of Systems: Negative except as noted in the HPI. Objective:   Constitutional Jill Harvey is a pleasant 87 y.o. Caucasian female, WD, WN in NAD. AAO x 3.   Vascular Capillary refill time to digits immediate b/l. Palpable DP pulse(s) b/l lower extremities Palpable PT pulse(s) b/l lower extremities Pedal hair absent. Lower extremity skin temperature gradient within normal limits. No pain with calf compression b/l. No cyanosis or clubbing noted.  Neurologic Normal speech. Oriented to person, place, and time. Protective sensation intact 5/5 intact bilaterally with 10g monofilament b/l. Proprioception intact bilaterally.  Dermatologic Pedal skin with normal turgor, texture and tone b/l lower extremities. No open wounds b/l lower extremities. No interdigital macerations b/l lower extremities. Toenails 1-5 b/l elongated, discolored, dystrophic, thickened, crumbly with subungual debris and tenderness to dorsal palpation. Porokeratotic lesion(s) submet head 1 left foot and 1st metatarsal head left foot. No erythema, no edema, no drainage, no fluctuance.  Orthopedic: Normal muscle strength 5/5 to all lower  extremity muscle groups bilaterally. No pain crepitus or joint limitation noted with ROM b/l lower extremities. Hammertoe(s) noted to the 2-5 bilaterally.   Radiographs: None Assessment:   1. Pain due to onychomycosis of toenails of both feet   2. Porokeratosis   3. Coagulation defect (HCC)   4. Pain in toes of both feet   5. Dermatophytosis of nail     Plan:  -Examined patient. -Patient to continue soft, supportive shoe gear daily. -Toenails 1-5 b/l were debrided in length and girth with sterile nail nippers and dremel without iatrogenic bleeding.  -Painful porokeratotic lesion(s) submet head 1 left foot and 1st metatarsal head left foot pared and enucleated with sterile scalpel blade without incident. Total number of lesions debrided=2. -Patient to report any pedal injuries to medical professional immediately. -Patient/POA to call should there be question/concern in the interim. Follow-up in 9 weeks with Dr. Toni Arthurs, MD

## 2021-07-29 ENCOUNTER — Ambulatory Visit (INDEPENDENT_AMBULATORY_CARE_PROVIDER_SITE_OTHER): Payer: Medicare Other | Admitting: Podiatry

## 2021-07-29 ENCOUNTER — Other Ambulatory Visit: Payer: Self-pay

## 2021-07-29 ENCOUNTER — Encounter: Payer: Self-pay | Admitting: Podiatry

## 2021-07-29 DIAGNOSIS — M79674 Pain in right toe(s): Secondary | ICD-10-CM

## 2021-07-29 DIAGNOSIS — B351 Tinea unguium: Secondary | ICD-10-CM | POA: Diagnosis not present

## 2021-07-29 DIAGNOSIS — M79675 Pain in left toe(s): Secondary | ICD-10-CM | POA: Diagnosis not present

## 2021-08-04 ENCOUNTER — Other Ambulatory Visit: Payer: Self-pay | Admitting: Internal Medicine

## 2021-08-04 ENCOUNTER — Ambulatory Visit
Admission: RE | Admit: 2021-08-04 | Discharge: 2021-08-04 | Disposition: A | Discharge status: Home / Self Care | Payer: Medicare Other | Source: Ambulatory Visit | Attending: Internal Medicine | Admitting: Internal Medicine

## 2021-08-04 DIAGNOSIS — M79672 Pain in left foot: Secondary | ICD-10-CM

## 2021-08-04 DIAGNOSIS — M7542 Impingement syndrome of left shoulder: Secondary | ICD-10-CM

## 2021-08-04 NOTE — Progress Notes (Signed)
Subjective: Jill Harvey is a 88 y.o. female patient seen today for follow up of  at risk foot care with h/o coagulation defect and painful elongated mycotic toenails 1-5 bilaterally which are tender when wearing enclosed shoe gear. Pain is relieved with periodic professional debridement..   New problem(s)/concern(s) today: None    PCP is Lajean Manes, MD. Last visit was: October, 2022.  Allergies  Allergen Reactions   Amoxicillin    Macrodantin [Nitrofurantoin Macrocrystal]     Objective: Physical Exam  General: Patient is a pleasant 88 y.o. Caucasian female WD, WN in NAD. AAO x 3.   Neurovascular Examination: CFT immediate b/l LE. Palpable DP/PT pulses b/l LE. Digital hair absent b/l. Skin temperature gradient WNL b/l. No pain with calf compression b/l. No edema noted b/l. No cyanosis or clubbing noted b/l LE.  Protective sensation intact 5/5 intact bilaterally with 10g monofilament b/l. Vibratory sensation intact b/l.  Dermatological:  Pedal integument with normal turgor, texture and tone b/l LE. No open wounds b/l. No interdigital macerations b/l. Toenails 1-5 b/l elongated, thickened, discolored with subungual debris. +Tenderness with dorsal palpation of nailplates. No hyperkeratotic or porokeratotic lesions present.  Musculoskeletal:  Normal muscle strength 5/5 to all lower extremity muscle groups bilaterally. Hammertoe deformity noted 2-5 b/l.Marland Kitchen No pain, crepitus or joint limitation noted with ROM b/l LE.  Patient ambulates independently without assistive aids.  Assessment: 1. Pain due to onychomycosis of toenails of both feet    Plan: Patient was evaluated and treated and all questions answered. Consent given for treatment as described below: -Mycotic toenails 1-5 bilaterally were debrided in length and girth with sterile nail nippers and dremel without incident. -Patient/POA to call should there be question/concern in the interim.  Return in about 3 months  (around 10/26/2021).  Marzetta Board, DPM

## 2021-09-30 ENCOUNTER — Encounter: Payer: Self-pay | Admitting: Podiatry

## 2021-09-30 ENCOUNTER — Ambulatory Visit (INDEPENDENT_AMBULATORY_CARE_PROVIDER_SITE_OTHER): Payer: Medicare Other | Admitting: Podiatry

## 2021-09-30 DIAGNOSIS — B351 Tinea unguium: Secondary | ICD-10-CM

## 2021-09-30 DIAGNOSIS — M79675 Pain in left toe(s): Secondary | ICD-10-CM | POA: Diagnosis not present

## 2021-09-30 DIAGNOSIS — M79674 Pain in right toe(s): Secondary | ICD-10-CM

## 2021-10-10 NOTE — Progress Notes (Signed)
?  Subjective:  ?Patient ID: Jill Harvey, female    DOB: 1925/10/28,  MRN: [id number] ? ?Jill Harvey presents to clinic today for painful thick toenails that are difficult to trim. Pain interferes with ambulation. Aggravating factors include wearing enclosed shoe gear. Pain is relieved with periodic professional debridement. ? ?New problem(s): None.  ? ?PCP is Merlene Laughter, MD , and last visit was March, 2023. ? ?Allergies  ?Allergen Reactions  ? Amoxicillin   ? Macrodantin [Nitrofurantoin Macrocrystal]   ? ? ?Review of Systems: Negative except as noted in the HPI. ? ?Objective: No changes noted in today's physical examination. ?General: Patient is a pleasant 88 y.o. Caucasian female WD, WN in NAD. AAO x 3.  ? ?Neurovascular Examination: ?CFT immediate b/l LE. Palpable DP/PT pulses b/l LE. Digital hair absent b/l. Skin temperature gradient WNL b/l. No pain with calf compression b/l. No edema noted b/l. No cyanosis or clubbing noted b/l LE. ? ?Protective sensation intact 5/5 intact bilaterally with 10g monofilament b/l. Vibratory sensation intact b/l. ? ?Dermatological:  ?Pedal integument with normal turgor, texture and tone b/l LE. No open wounds b/l. No interdigital macerations b/l. Toenails 1-5 b/l elongated, thickened, discolored with subungual debris. +Tenderness with dorsal palpation of nailplates. No hyperkeratotic or porokeratotic lesions present. ? ?Musculoskeletal:  ?Normal muscle strength 5/5 to all lower extremity muscle groups bilaterally. Hammertoe deformity noted 2-5 b/l.Marland Kitchen No pain, crepitus or joint limitation noted with ROM b/l LE.  Patient ambulates with cane assistance. ? ?Assessment/Plan: ?1. Pain due to onychomycosis of toenails of both feet   ?  ?-Examined patient. ?-Patient to continue soft, supportive shoe gear daily. ?-Toenails 1-5 b/l were debrided in length and girth with sterile nail nippers and dremel without iatrogenic bleeding.  ?-Patient/POA to call should there be  question/concern in the interim.  ? ?Return in about 9 weeks (around 12/02/2021). ? ?Freddie Breech, DPM  ?

## 2021-12-04 ENCOUNTER — Encounter: Payer: Self-pay | Admitting: Podiatry

## 2021-12-04 ENCOUNTER — Ambulatory Visit (INDEPENDENT_AMBULATORY_CARE_PROVIDER_SITE_OTHER): Payer: Medicare Other | Admitting: Podiatry

## 2021-12-04 DIAGNOSIS — M79675 Pain in left toe(s): Secondary | ICD-10-CM | POA: Diagnosis not present

## 2021-12-04 DIAGNOSIS — B351 Tinea unguium: Secondary | ICD-10-CM

## 2021-12-04 DIAGNOSIS — M79674 Pain in right toe(s): Secondary | ICD-10-CM

## 2021-12-14 NOTE — Progress Notes (Signed)
  Subjective:  Patient ID: Jill Harvey, female    DOB: [birth year],  MRN: [id number]  88 y.o. female presents painful thick toenails that are difficult to trim. Pain interferes with ambulation. Aggravating factors include wearing enclosed shoe gear. Pain is relieved with periodic professional debridement.  New problem(s): None   PCP is Merlene Laughter, MD , and last visit was May, 2023.  Allergies  Allergen Reactions   Amoxicillin    Macrodantin [Nitrofurantoin Macrocrystal]     Review of Systems: Negative except as noted in the HPI.   Objective:  Vascular Examination: Vascular status intact b/l with palpable pedal pulses. CFT immediate b/l. No edema. No pain with calf compression b/l. Skin temperature gradient WNL b/l. No cyanosis or clubbing noted b/l LE.  Neurological Examination: Sensation grossly intact b/l with 10 gram monofilament. Vibratory sensation intact b/l.   Dermatological Examination: Pedal skin with normal turgor, texture and tone b/l. Toenails 1-5 b/l thick, discolored, elongated with subungual debris and pain on dorsal palpation. No hyperkeratotic lesions noted b/l.   Musculoskeletal Examination: Muscle strength 5/5 to b/l LE. No pain, crepitus or joint limitation noted with ROM bilateral LE. Hammertoe deformity noted 2-5 b/l.  Radiographs: None  Assessment:   1. Pain due to onychomycosis of toenails of both feet     Plan:  -Patient was evaluated and treated. All patient's and/or POA's questions/concerns answered on today's visit. -No new findings. No new orders. -Mycotic toenails 1-5 bilaterally were debrided in length and girth with sterile nail nippers and dremel without incident. -Patient/POA to call should there be question/concern in the interim.  Return in about 3 months (around 03/06/2022).  Freddie Breech, DPM

## 2022-02-03 ENCOUNTER — Ambulatory Visit (INDEPENDENT_AMBULATORY_CARE_PROVIDER_SITE_OTHER): Payer: Medicare Other | Admitting: Podiatry

## 2022-02-03 ENCOUNTER — Encounter: Payer: Self-pay | Admitting: Podiatry

## 2022-02-03 DIAGNOSIS — M79674 Pain in right toe(s): Secondary | ICD-10-CM | POA: Diagnosis not present

## 2022-02-03 DIAGNOSIS — L84 Corns and callosities: Secondary | ICD-10-CM | POA: Diagnosis not present

## 2022-02-03 DIAGNOSIS — B351 Tinea unguium: Secondary | ICD-10-CM

## 2022-02-03 DIAGNOSIS — M79675 Pain in left toe(s): Secondary | ICD-10-CM

## 2022-02-10 NOTE — Progress Notes (Signed)
  Subjective:  Patient ID: Jill Harvey, female    DOB: 1926-01-17,  MRN: [id number]  88 y.o. female presents with callus(es) left lower extremity and painful thick toenails that are difficult to trim. Painful toenails interfere with ambulation. Aggravating factors include wearing enclosed shoe gear. Pain is relieved with periodic professional debridement. Painful calluses are aggravated when weightbearing with and without shoegear. Pain is relieved with periodic professional debridement..    New problem(s): None    PCP: Merlene Laughter, MD and last visit was: February, 2023.  Review of Systems: Negative except as noted in the HPI.   Allergies  Allergen Reactions   Amoxicillin    Macrodantin [Nitrofurantoin Macrocrystal]    Objective:  There were no vitals filed for this visit. Constitutional Patient is a pleasant 88 y.o. Caucasian female WD, WN in NAD. AAO x 3.  Vascular Capillary fill time to digits immediate b/l.  DP/PT pulse(s) are palpable b/l lower extremities. Pedal hair sparse. Lower extremity skin temperature gradient within normal limits. No pain with calf compression b/l. No edema noted b/l lower extremities. No cyanosis or clubbing noted.   Neurologic Protective sensation intact 5/5 intact bilaterally with 10g monofilament b/l. Vibratory sensation intact b/l. No clonus b/l.   Dermatologic Pedal skin is warm and supple b/l.  No open wounds b/l lower extremities. No interdigital macerations b/l lower extremities. Toenails 1-5 b/l elongated, discolored, dystrophic, thickened, crumbly with subungual debris and tenderness to dorsal palpation. Hyperkeratotic lesion(s) 1st metatarsal head left lower extremity.  No erythema, no edema, no drainage, no fluctuance.  Orthopedic: Normal muscle strength 5/5 to all lower extremity muscle groups bilaterally. Patient ambulates independent of any assistive aids. Hammertoe deformity noted 2-5 b/l.   Assessment:   1. Pain due to onychomycosis  of toenails of both feet   2. Callus    Plan:  Patient was evaluated and treated and all questions answered. Consent given for treatment as described below: -Examined patient. -Toenails 1-5 b/l were debrided in length and girth with sterile nail nippers and dremel without iatrogenic bleeding.  -Callus(es) 1st metatarsal head left lower extremity pared utilizing sterile scalpel blade without complication or incident. Total number debrided =1. -Patient/POA to call should there be question/concern in the interim.  Return in about 3 months (around 05/06/2022).  Freddie Breech, DPM

## 2022-03-30 ENCOUNTER — Encounter: Payer: Self-pay | Admitting: Podiatry

## 2022-03-30 ENCOUNTER — Telehealth: Payer: Self-pay | Admitting: Podiatry

## 2022-03-30 NOTE — Telephone Encounter (Signed)
PHONE # IS NIS, CALLED DAUGHTER CANDACE LVM TO CB TO R/S APPT FOR 10-30 OR 10-31 [facility]

## 2022-04-07 ENCOUNTER — Ambulatory Visit: Payer: Medicare Other | Admitting: Podiatry

## 2022-04-13 ENCOUNTER — Encounter: Payer: Self-pay | Admitting: Podiatry

## 2022-04-13 ENCOUNTER — Ambulatory Visit (INDEPENDENT_AMBULATORY_CARE_PROVIDER_SITE_OTHER): Payer: Medicare Other | Admitting: Podiatry

## 2022-04-13 DIAGNOSIS — B351 Tinea unguium: Secondary | ICD-10-CM

## 2022-04-13 DIAGNOSIS — D689 Coagulation defect, unspecified: Secondary | ICD-10-CM | POA: Diagnosis not present

## 2022-04-13 DIAGNOSIS — M79674 Pain in right toe(s): Secondary | ICD-10-CM | POA: Diagnosis not present

## 2022-04-13 DIAGNOSIS — M79675 Pain in left toe(s): Secondary | ICD-10-CM

## 2022-04-18 NOTE — Progress Notes (Signed)
  Subjective:  Patient ID: Jill Harvey, female    DOB: [birth year],  MRN: [id number]  MAESON LOURENCO presents to clinic today for for at risk foot care. Patient has h/o PAD and painful elongated mycotic toenails 1-5 bilaterally which are tender when wearing enclosed shoe gear. Pain is relieved with periodic professional debridement.  Chief Complaint  Patient presents with   Nail Problem    Nail Trim Not Diabetic PCP - Dr Felipa Eth, in the middle of switching providers    New problem(s): None.   PCP is Lajean Manes, MD.  Allergies  Allergen Reactions   Amoxicillin    Macrodantin [Nitrofurantoin Macrocrystal]     Review of Systems: Negative except as noted in the HPI.  Objective:   [place] is a pleasant 88 y.o. female WD, WN in NAD. AAO x 86. Vascular Capillary fill time to digits immediate b/l.  DP/PT pulse(s) are palpable b/l lower extremities. Pedal hair sparse. Lower extremity skin temperature gradient within normal limits. No pain with calf compression b/l. No edema noted b/l lower extremities. No cyanosis or clubbing noted.   Neurologic Protective sensation intact 5/5 intact bilaterally with 10g monofilament b/l. Vibratory sensation intact b/l. No clonus b/l.   Dermatologic Pedal skin is warm and supple b/l.  No open wounds b/l lower extremities. No interdigital macerations b/l lower extremities. Toenails 1-5 b/l elongated, discolored, dystrophic, thickened, crumbly with subungual debris and tenderness to dorsal palpation.   Resolved hyperkeratotic lesion(s) 1st metatarsal head left lower extremity.  No erythema, no edema, no drainage, no fluctuance.  Orthopedic: Normal muscle strength 5/5 to all lower extremity muscle groups bilaterally. Patient ambulates independent of any assistive aids. Hammertoe deformity noted 2-5 b/l.   Assessment/Plan: 1. Pain due to onychomycosis of toenails of both feet   2. Coagulation defect ([place])     No orders of the defined  types were placed in this encounter.   -Patient was evaluated and treated. All patient's and/or POA's questions/concerns answered on today's visit. -Continue supportive shoe gear daily. -Mycotic toenails 1-5 bilaterally were debrided in length and girth with sterile nail nippers and dremel without incident. -Patient/POA to call should there be question/concern in the interim.   Return in about 3 months (around 07/14/2022).  Marzetta Board, DPM

## 2022-06-18 ENCOUNTER — Encounter: Payer: Self-pay | Admitting: Podiatry

## 2022-06-18 ENCOUNTER — Ambulatory Visit (INDEPENDENT_AMBULATORY_CARE_PROVIDER_SITE_OTHER): Payer: Medicare Other | Admitting: Podiatry

## 2022-06-18 DIAGNOSIS — M79674 Pain in right toe(s): Secondary | ICD-10-CM | POA: Diagnosis not present

## 2022-06-18 DIAGNOSIS — M79675 Pain in left toe(s): Secondary | ICD-10-CM

## 2022-06-18 DIAGNOSIS — B351 Tinea unguium: Secondary | ICD-10-CM | POA: Diagnosis not present

## 2022-06-18 NOTE — Progress Notes (Unsigned)
  Subjective:  Patient ID: Jill Harvey, female    DOB: [birth year],  MRN: [id number]  Jill Harvey presents to clinic today for {jgcomplaint:23593}  Chief Complaint  Patient presents with   Nail Problem    RFC PCP-Henderson,Eli PCP VST-02/2023   New problem(s): None. {jgcomplaint:23593}  PCP is Kathalene Frames, MD.  Allergies  Allergen Reactions   Amoxicillin    Macrodantin [Nitrofurantoin Macrocrystal]     Review of Systems: Negative except as noted in the HPI.  Objective: No changes noted in today's physical examination. There were no vitals filed for this visit. [name] is a pleasant 89 y.o. female {jgbodyhabitus:24098} AAO x 3. Vascular Capillary fill time to digits immediate b/l.  DP/PT pulse(s) are palpable b/l lower extremities. Pedal hair sparse. Lower extremity skin temperature gradient within normal limits. No pain with calf compression b/l. No edema noted b/l lower extremities. No cyanosis or clubbing noted.   Neurologic Protective sensation intact 5/5 intact bilaterally with 10g monofilament b/l. Vibratory sensation intact b/l. No clonus b/l.   Dermatologic Pedal skin is warm and supple b/l.  No open wounds b/l lower extremities. No interdigital macerations b/l lower extremities. Toenails 1-5 b/l elongated, discolored, dystrophic, thickened, crumbly with subungual debris and tenderness to dorsal palpation.   Resolved hyperkeratotic lesion(s) 1st metatarsal head left lower extremity.  No erythema, no edema, no drainage, no fluctuance.  Orthopedic: Normal muscle strength 5/5 to all lower extremity muscle groups bilaterally. Patient ambulates independent of any assistive aids. Hammertoe deformity noted 2-5 b/l.   Assessment/Plan: No diagnosis found.  No orders of the defined types were placed in this encounter.   None {Jgplan:23602::"-Patient/POA to call should there be question/concern in the interim."}   Return in about 9 weeks (around  08/20/2022).  Marzetta Board, DPM

## 2022-08-25 ENCOUNTER — Ambulatory Visit (INDEPENDENT_AMBULATORY_CARE_PROVIDER_SITE_OTHER): Payer: Medicare Other | Admitting: Podiatry

## 2022-08-25 DIAGNOSIS — M79675 Pain in left toe(s): Secondary | ICD-10-CM

## 2022-08-25 DIAGNOSIS — Q828 Other specified congenital malformations of skin: Secondary | ICD-10-CM

## 2022-08-25 DIAGNOSIS — M79674 Pain in right toe(s): Secondary | ICD-10-CM | POA: Diagnosis not present

## 2022-08-25 DIAGNOSIS — B351 Tinea unguium: Secondary | ICD-10-CM | POA: Diagnosis not present

## 2022-08-25 DIAGNOSIS — D689 Coagulation defect, unspecified: Secondary | ICD-10-CM | POA: Diagnosis not present

## 2022-08-28 ENCOUNTER — Encounter: Payer: Self-pay | Admitting: Podiatry

## 2022-08-28 NOTE — Progress Notes (Signed)
  Subjective:  Patient ID: Jill Harvey, female    DOB: January 01, 1926,  MRN: [id number]  Jill Harvey presents to clinic today for at risk foot care with h/o coagulation defect  Chief Complaint  Patient presents with   Nail Problem    RFC PCP-Henderson, Roderic Palau PCP VST- 6 months ago   New problem(s): None.   PCP is Kathalene Frames, MD.  Allergies  Allergen Reactions   Amoxicillin    Macrodantin [Nitrofurantoin Macrocrystal]     Review of Systems: Negative except as noted in the HPI.  Objective: No changes noted in today's physical examination. There were no vitals filed for this visit. Jill Harvey is a pleasant 89 y.o. female WD, WN in NAD. AAO x 3.  Vascular Capillary fill time to digits immediate b/l.  DP/PT pulse(s) are palpable b/l lower extremities. Pedal hair sparse. Lower extremity skin temperature gradient within normal limits. No pain with calf compression b/l. No edema noted b/l lower extremities. No cyanosis or clubbing noted.   Neurologic Protective sensation intact 5/5 intact bilaterally with 10g monofilament b/l. Vibratory sensation intact b/l. No clonus b/l.   Dermatologic Pedal skin is warm and supple b/l.  No open wounds b/l lower extremities. No interdigital macerations b/l lower extremities. Toenails 1-5 b/l elongated, discolored, dystrophic, thickened, crumbly with subungual debris and tenderness to dorsal palpation.   Hyperkeratotic lesion(s) 1st metatarsal head left lower extremity.  No erythema, no edema, no drainage, no fluctuance.  Orthopedic: Normal muscle strength 5/5 to all lower extremity muscle groups bilaterally. Patient ambulates independent of any assistive aids. Hammertoe deformity noted 2-5 b/l.   Assessment/Plan: 1. Pain due to onychomycosis of toenails of both feet   2. Porokeratosis   3. Coagulation defect [facility])    -Patient was evaluated and treated. All patient's and/or POA's questions/concerns answered on today's  visit. -Continue foot and shoe inspections daily. Monitor blood glucose per PCP/Endocrinologist's recommendations. -Patient to continue soft, supportive shoe gear daily. -Mycotic toenails 1-5 bilaterally were debrided in length and girth with sterile nail nippers and dremel without incident. -Porokeratotic lesion(s) submet head 1 left foot pared and enucleated with sterile currette without incident. Total number of lesions debrided=1. -Patient/POA to call should there be question/concern in the interim.   Return in about 3 months (around 11/25/2022).  Marzetta Board, DPM

## 2022-10-27 ENCOUNTER — Ambulatory Visit (INDEPENDENT_AMBULATORY_CARE_PROVIDER_SITE_OTHER): Payer: Medicare Other | Admitting: Podiatry

## 2022-10-27 ENCOUNTER — Encounter: Payer: Self-pay | Admitting: Podiatry

## 2022-10-27 DIAGNOSIS — D689 Coagulation defect, unspecified: Secondary | ICD-10-CM

## 2022-10-27 DIAGNOSIS — B351 Tinea unguium: Secondary | ICD-10-CM | POA: Diagnosis not present

## 2022-10-27 DIAGNOSIS — M79674 Pain in right toe(s): Secondary | ICD-10-CM

## 2022-10-27 DIAGNOSIS — Q828 Other specified congenital malformations of skin: Secondary | ICD-10-CM | POA: Diagnosis not present

## 2022-10-27 DIAGNOSIS — M79675 Pain in left toe(s): Secondary | ICD-10-CM

## 2022-10-30 NOTE — Progress Notes (Signed)
  Subjective:  Patient ID: Jill Harvey, female    DOB: 1926-03-28,  MRN: [id number]  Jill Harvey presents to clinic today for at risk foot care with h/o coagulation defect and callus(es) left lower extremity and painful thick toenails that are difficult to trim. Painful toenails interfere with ambulation. Aggravating factors include wearing enclosed shoe gear. Pain is relieved with periodic professional debridement. Painful calluses are aggravated when weightbearing with and without shoegear. Pain is relieved with periodic professional debridement.  Chief Complaint  Patient presents with   Nail Problem    RFC PCP-Henderson PCP VST-6 months ago   New problem(s): None.   PCP is Emilio Aspen, MD.  Allergies  Allergen Reactions   Amoxicillin    Macrodantin [Nitrofurantoin Macrocrystal]     Review of Systems: Negative except as noted in the HPI.  Objective: No changes noted in today's physical examination. There were no vitals filed for this visit. [place] B Harvey is a pleasant 89 y.o. female WD, WN in NAD. AAO x 3.  Vascular Capillary fill time to digits immediate b/l.  DP/PT pulse(s) are palpable b/l lower extremities. Pedal hair sparse. Lower extremity skin temperature gradient within normal limits. No pain with calf compression b/l. No edema noted b/l lower extremities. No cyanosis or clubbing noted.   Neurologic Protective sensation intact 5/5 intact bilaterally with 10g monofilament b/l. Vibratory sensation intact b/l. No clonus b/l.   Dermatologic Pedal skin is warm and supple b/l.  No open wounds b/l lower extremities. No interdigital macerations b/l lower extremities. Toenails 1-5 b/l elongated, discolored, dystrophic, thickened, crumbly with subungual debris and tenderness to dorsal palpation.   Porokeratotic lesion(s) 1st metatarsal head left lower extremity.  No erythema, no edema, no drainage, no fluctuance.  Orthopedic: Normal muscle strength 5/5 to  all lower extremity muscle groups bilaterally. Patient ambulates independent of any assistive aids. Hammertoe deformity noted 2-5 b/l.   Assessment/Plan: 1. Pain due to onychomycosis of toenails of both feet   2. Porokeratosis   3. Coagulation defect [facility])     -Patient's family member present. All questions/concerns addressed on today's visit. -Consent given for treatment as described below: -Examined patient. -Continue supportive shoe gear daily. -Toenails 1-5 b/l were debrided in length and girth with sterile nail nippers and dremel without iatrogenic bleeding.  -Porokeratotic lesion(s) submet head 1 left foot pared and enucleated with sterile currette without incident. Total number of lesions debrided=1. -Patient/POA to call should there be question/concern in the interim.   Return in about 9 weeks (around 12/29/2022).  Freddie Breech, DPM

## 2022-12-30 DIAGNOSIS — I10 Essential (primary) hypertension: Secondary | ICD-10-CM | POA: Diagnosis not present

## 2022-12-30 DIAGNOSIS — G4709 Other insomnia: Secondary | ICD-10-CM | POA: Diagnosis not present

## 2023-01-03 ENCOUNTER — Ambulatory Visit (INDEPENDENT_AMBULATORY_CARE_PROVIDER_SITE_OTHER): Payer: Medicare Other | Admitting: Podiatry

## 2023-01-03 DIAGNOSIS — L84 Corns and callosities: Secondary | ICD-10-CM | POA: Diagnosis not present

## 2023-01-03 DIAGNOSIS — I739 Peripheral vascular disease, unspecified: Secondary | ICD-10-CM

## 2023-01-03 DIAGNOSIS — B351 Tinea unguium: Secondary | ICD-10-CM

## 2023-01-03 NOTE — Progress Notes (Signed)
    Subjective:  Patient ID: Jill Harvey, female    DOB: 1925-09-14,  MRN: [id number]  Jill Harvey presents to clinic today for:  Chief Complaint  Patient presents with   Nail Problem    DFC  . Patient notes nails are thick and elongated, causing pain in shoe gear when ambulating.  Her daughter is with her today.  There is also a painful callus left submet 1  PCP is Emilio Aspen, MD. date last seen was 12/27/2022  Allergies  Allergen Reactions   Amoxicillin    Macrodantin [Nitrofurantoin Macrocrystal]     Review of Systems: Negative except as noted in the HPI.  Objective:  There were no vitals filed for this visit.  Jill Harvey is a pleasant 89 y.o. female in NAD. AAO x 3.  Vascular Examination: Patient has palpable DP pulse, absent PT pulse bilateral.  Delayed capillary refill bilateral toes.  Sparse digital hair bilateral.  Proximal to distal cooling WNL bilateral.    Dermatological Examination: Interspaces are clear with no open lesions noted bilateral.  Nails are 3-69mm thick, with yellowish/brown discoloration, subungual debris and distal onycholysis x10.  There is pain with compression of nails x10.  There are hyperkeratotic lesions noted left submet 1.  Patient qualifies for at-risk foot care because of PVD.  Assessment/Plan: 1. Dermatophytosis of nail   2. PVD (peripheral vascular disease) (HCC)   3. Callus of foot    Mycotic nails x10 were sharply debrided with sterile nail nippers and power debriding burr to decrease bulk and length.  Hyperkeratotic lesions x 1 was shaved with #312 blade.   Return in about 9 weeks (around 03/07/2023) for RFC.   Clerance Lav, DPM, FACFAS [facility]     [address] [place], [place] [address]                Office [phone]  Fax [phone]

## 2023-03-03 DIAGNOSIS — G9340 Encephalopathy, unspecified: Secondary | ICD-10-CM

## 2023-03-03 DIAGNOSIS — Z7901 Long term (current) use of anticoagulants: Secondary | ICD-10-CM

## 2023-03-03 DIAGNOSIS — R0609 Other forms of dyspnea: Secondary | ICD-10-CM

## 2023-03-03 DIAGNOSIS — I10 Essential (primary) hypertension: Secondary | ICD-10-CM

## 2023-03-03 DIAGNOSIS — E785 Hyperlipidemia, unspecified: Secondary | ICD-10-CM

## 2023-03-03 DIAGNOSIS — I4819 Other persistent atrial fibrillation: Secondary | ICD-10-CM

## 2023-03-04 ENCOUNTER — Encounter: Payer: Self-pay | Admitting: Podiatry

## 2023-03-09 ENCOUNTER — Encounter: Payer: Self-pay | Admitting: Podiatry

## 2023-03-09 ENCOUNTER — Ambulatory Visit: Payer: Medicare Other | Admitting: Podiatry

## 2023-03-09 ENCOUNTER — Ambulatory Visit (INDEPENDENT_AMBULATORY_CARE_PROVIDER_SITE_OTHER): Payer: Medicare Other | Admitting: Podiatry

## 2023-03-09 DIAGNOSIS — B351 Tinea unguium: Secondary | ICD-10-CM

## 2023-03-09 DIAGNOSIS — I4819 Other persistent atrial fibrillation: Secondary | ICD-10-CM | POA: Diagnosis not present

## 2023-03-09 DIAGNOSIS — L84 Corns and callosities: Secondary | ICD-10-CM

## 2023-03-09 DIAGNOSIS — I10 Essential (primary) hypertension: Secondary | ICD-10-CM | POA: Diagnosis not present

## 2023-03-09 DIAGNOSIS — M79674 Pain in right toe(s): Secondary | ICD-10-CM

## 2023-03-09 DIAGNOSIS — I739 Peripheral vascular disease, unspecified: Secondary | ICD-10-CM

## 2023-03-09 DIAGNOSIS — M79675 Pain in left toe(s): Secondary | ICD-10-CM

## 2023-03-09 DIAGNOSIS — R0602 Shortness of breath: Secondary | ICD-10-CM | POA: Diagnosis not present

## 2023-03-09 NOTE — Progress Notes (Signed)
This patient returns to my office for at risk foot care.  This patient requires this care by a professional since this patient will be at risk due to having chronic anticoagulation. This patient is unable to cut nails herself since the patient cannot reach her nails.These nails are painful walking and wearing shoes.  This patient presents for at risk foot care today.  General Appearance  Alert, conversant and in no acute stress.  Vascular  Dorsalis pedis and posterior tibial  pulses are  weakly palpable  bilaterally.  Capillary return is within normal limits  bilaterally. Temperature is within normal limits  bilaterally.  Neurologic  Senn-Weinstein monofilament wire test within normal limits  bilaterally. Muscle power within normal limits bilaterally.  Nails Thick disfigured discolored nails with subungual debris  from hallux to fifth toes bilaterally. No evidence of bacterial infection or drainage bilaterally.  Orthopedic  No limitations of motion  feet .  No crepitus or effusions noted.  No bony pathology or digital deformities noted.  Skin  normotropic skin with no porokeratosis noted bilaterally.  No signs of infections or ulcers noted.     Onychomycosis  Pain in right toes  Pain in left toes  Consent was obtained for treatment procedures.   Mechanical debridement of nails 1-5  bilaterally performed with a nail nipper.  Filed with dremel without incident.    Return office visit   9 weeks                 Told patient to return for periodic foot care and evaluation due to potential at risk complications.   Helane Gunther DPM

## 2023-03-10 DIAGNOSIS — R0609 Other forms of dyspnea: Secondary | ICD-10-CM | POA: Diagnosis not present

## 2023-03-10 DIAGNOSIS — Z7901 Long term (current) use of anticoagulants: Secondary | ICD-10-CM | POA: Diagnosis not present

## 2023-03-10 DIAGNOSIS — I4819 Other persistent atrial fibrillation: Secondary | ICD-10-CM | POA: Diagnosis not present

## 2023-05-31 ENCOUNTER — Ambulatory Visit (INDEPENDENT_AMBULATORY_CARE_PROVIDER_SITE_OTHER): Payer: Medicare Other | Admitting: Podiatry

## 2023-05-31 ENCOUNTER — Encounter: Payer: Self-pay | Admitting: Podiatry

## 2023-05-31 DIAGNOSIS — M79674 Pain in right toe(s): Secondary | ICD-10-CM

## 2023-05-31 DIAGNOSIS — M79675 Pain in left toe(s): Secondary | ICD-10-CM | POA: Diagnosis not present

## 2023-05-31 DIAGNOSIS — D689 Coagulation defect, unspecified: Secondary | ICD-10-CM | POA: Diagnosis not present

## 2023-05-31 DIAGNOSIS — L84 Corns and callosities: Secondary | ICD-10-CM

## 2023-05-31 DIAGNOSIS — B351 Tinea unguium: Secondary | ICD-10-CM

## 2023-06-05 NOTE — Progress Notes (Signed)
  Subjective:  Patient ID: Jill Harvey, female    DOB: 1926-03-01,  MRN: [id number]  89 y.o. female presents to clinic with  at risk foot care with h/o coagulation defect and painful porokeratotic lesion(s) left foot and painful mycotic toenails that limit ambulation. Painful toenails interfere with ambulation. Aggravating factors include wearing enclosed shoe gear. Pain is relieved with periodic professional debridement. Painful porokeratotic lesions are aggravated when weightbearing with and without shoegear. Pain is relieved with periodic professional debridement.  Chief Complaint  Patient presents with   Routine Post Op    PATIENT STATES SHE SAW HER PCP 3 MONTHS AGO      New problem(s): None   PCP is Eloisa Northern, MD.  Allergies  Allergen Reactions   Amoxicillin    Macrodantin [Nitrofurantoin Macrocrystal]     Review of Systems: Negative except as noted in the HPI.   Objective:  [place] is a pleasant 89 y.o. female WD, WN in NAD.Marland Kitchen AAO x 3.  Vascular Examination: Capillary refill time to digits immediate b/l. Palpable DP pulse(s) b/l LE. Palpable PT pulse(s) b/l LE. Pedal hair sparse. No pain with calf compression b/l. No edema noted b/l LE.  Neurological Examination: Sensation grossly intact b/l with 10 gram monofilament. Vibratory sensation intact b/l.   Dermatological Examination: Pedal skin with normal turgor, texture and tone b/l. Toenails 1-5 b/l thick, discolored, elongated with subungual debris and pain on dorsal palpation. Porokeratotic lesion(s) 1st metatarsal head left foot. No erythema, no edema, no drainage, no fluctuance.  Musculoskeletal Examination: Muscle strength 5/5 to b/l LE. Hammertoe deformity noted 2-5 b/l.  Radiographs: None  Last A1c:       No data to display           Assessment:   1. Pain due to onychomycosis of toenails of both feet   2. Callus of foot   3. Coagulation defect [facility])     Plan:  -Patient was evaluated  today. All questions/concerns addressed on today's visit. -Mycotic toenails 1-5 bilaterally were debrided in length and girth with sterile nail nippers and dremel without incident. -Porokeratotic lesion(s) submet head 1 left foot pared and enucleated with sterile currette without incident. Total number of lesions debrided=1. -Patient/POA to call should there be question/concern in the interim.  Return in about 9 weeks (around 08/02/2023).  Freddie Breech, DPM      [place] LOCATION: [address] [place] [address]                   Office [phone]   [facility] LOCATION: [address] [place], [place] [address] Office [phone]

## 2023-08-03 ENCOUNTER — Ambulatory Visit: Payer: Medicare Other | Admitting: Podiatry

## 2023-08-17 ENCOUNTER — Ambulatory Visit: Payer: Medicare Other | Admitting: Podiatry

## 2023-08-17 ENCOUNTER — Encounter: Payer: Self-pay | Admitting: Podiatry

## 2023-08-17 VITALS — Ht 64.5 in | Wt 127.8 lb

## 2023-08-17 DIAGNOSIS — M79674 Pain in right toe(s): Secondary | ICD-10-CM | POA: Diagnosis not present

## 2023-08-17 DIAGNOSIS — D689 Coagulation defect, unspecified: Secondary | ICD-10-CM | POA: Diagnosis not present

## 2023-08-17 DIAGNOSIS — Q828 Other specified congenital malformations of skin: Secondary | ICD-10-CM | POA: Diagnosis not present

## 2023-08-17 DIAGNOSIS — M79675 Pain in left toe(s): Secondary | ICD-10-CM | POA: Diagnosis not present

## 2023-08-17 DIAGNOSIS — B351 Tinea unguium: Secondary | ICD-10-CM

## 2023-08-17 NOTE — Progress Notes (Signed)
  Subjective:  Patient ID: Jill Harvey, female    DOB: [birth year],  MRN: [id number]  89 y.o. female presents at risk foot care with h/o coagulation defect and painful porokeratotic lesion(s) left lower extremity and painful mycotic toenails that limit ambulation. Painful toenails interfere with ambulation. Aggravating factors include wearing enclosed shoe gear. Pain is relieved with periodic professional debridement. Painful porokeratotic lesions are aggravated when weightbearing with and without shoegear. Pain is relieved with periodic professional debridement.  Chief Complaint  Patient presents with   Nail Problem    Pt is here for [facility] PCP is Dr Nelson Chimes and LOV was last year.   New problem(s): None   PCP is Eloisa Northern, MD.  Allergies  Allergen Reactions   Amoxicillin    Macrodantin [Nitrofurantoin Macrocrystal]    Review of Systems: Negative except as noted in the HPI.   Objective:  [place] is a pleasant 89 y.o. female WD, WN in NAD. AAO x 3.  Vascular Examination: Vascular status intact b/l with palpable pedal pulses. CFT immediate b/l. Pedal hair present. No edema. No pain with calf compression b/l. Skin temperature gradient WNL b/l. No varicosities noted. No cyanosis or clubbing noted.  Neurological Examination: Sensation grossly intact b/l with 10 gram monofilament. Vibratory sensation intact b/l.  Dermatological Examination: Pedal skin with normal turgor, texture and tone b/l. No open wounds nor interdigital macerations noted. Toenails 1-5 b/l thick, discolored, elongated with subungual debris and pain on dorsal palpation. Porokeratotic lesion(s) 1st metatarsal head left lower extremity. No erythema, no edema, no drainage, no fluctuance.   Musculoskeletal Examination: Muscle strength 5/5 to b/l LE.  No pain, crepitus noted b/l. Hammertoe deformity noted 2-5 b/l.  Radiographs: None  Assessment:   1. Pain due to onychomycosis of toenails of both feet   2.  Porokeratosis   3. Coagulation defect [facility])    Plan:  -Consent given for treatment as described below: -Examined patient. -Mycotic toenails 1-5 bilaterally were debrided in length and girth with sterile nail nippers and dremel without incident. -Porokeratotic lesion(s) submet head 1 left foot pared and enucleated with sterile currette without incident. Total number of lesions debrided=1. -Patient/POA to call should there be question/concern in the interim.  Return in about 10 weeks (around 10/26/2023).  Freddie Breech, DPM       LOCATION: [address], [place] [address]                   Office [phone]   [facility] LOCATION: [address] [place], [place] [address] Office [phone]

## 2023-08-21 ENCOUNTER — Encounter: Payer: Self-pay | Admitting: Podiatry

## 2023-09-20 IMAGING — CR DG SHOULDER 2+V*L*
2 series · 2 of 2 positions shown · non-contrast
Comparison: None.

CLINICAL DATA: Status post fall 08/02/2021.  Shoulder pain.

EXAM:
LEFT SHOULDER - 2+ VIEW

[w shoulder ap internal left]
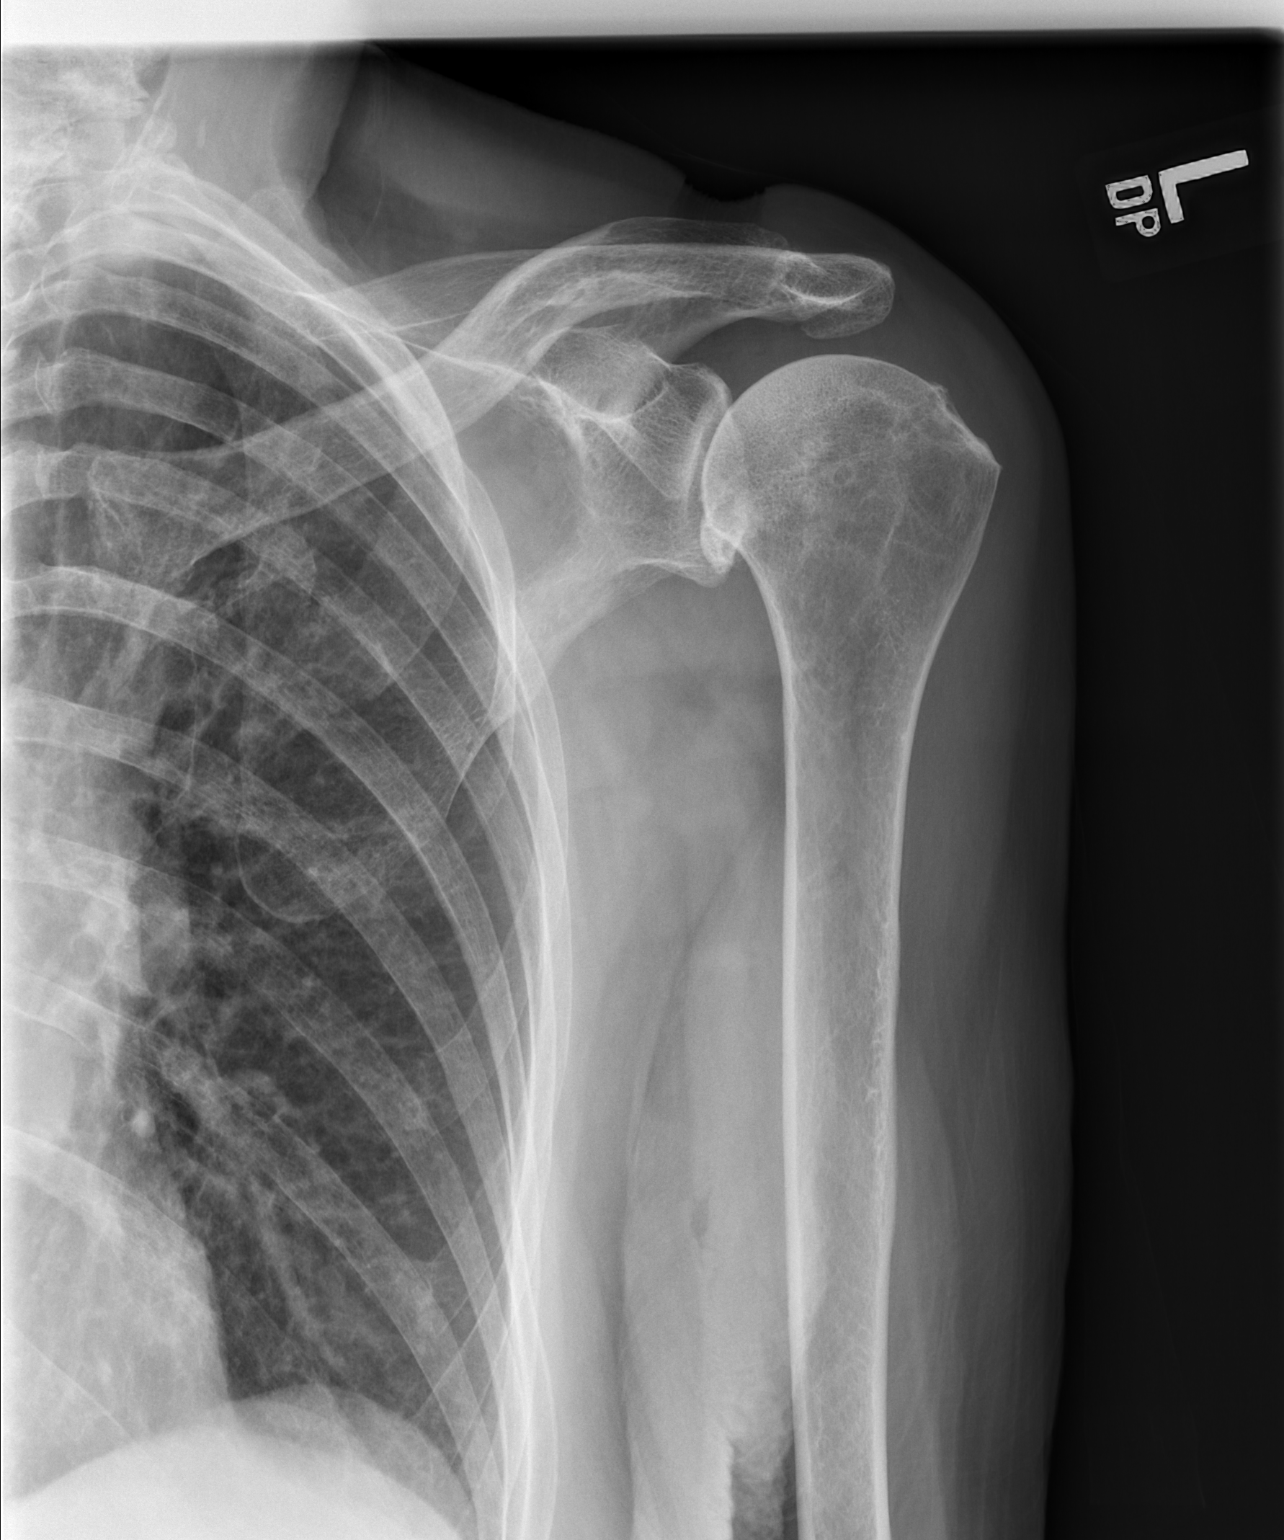

[w shoulder y view left]
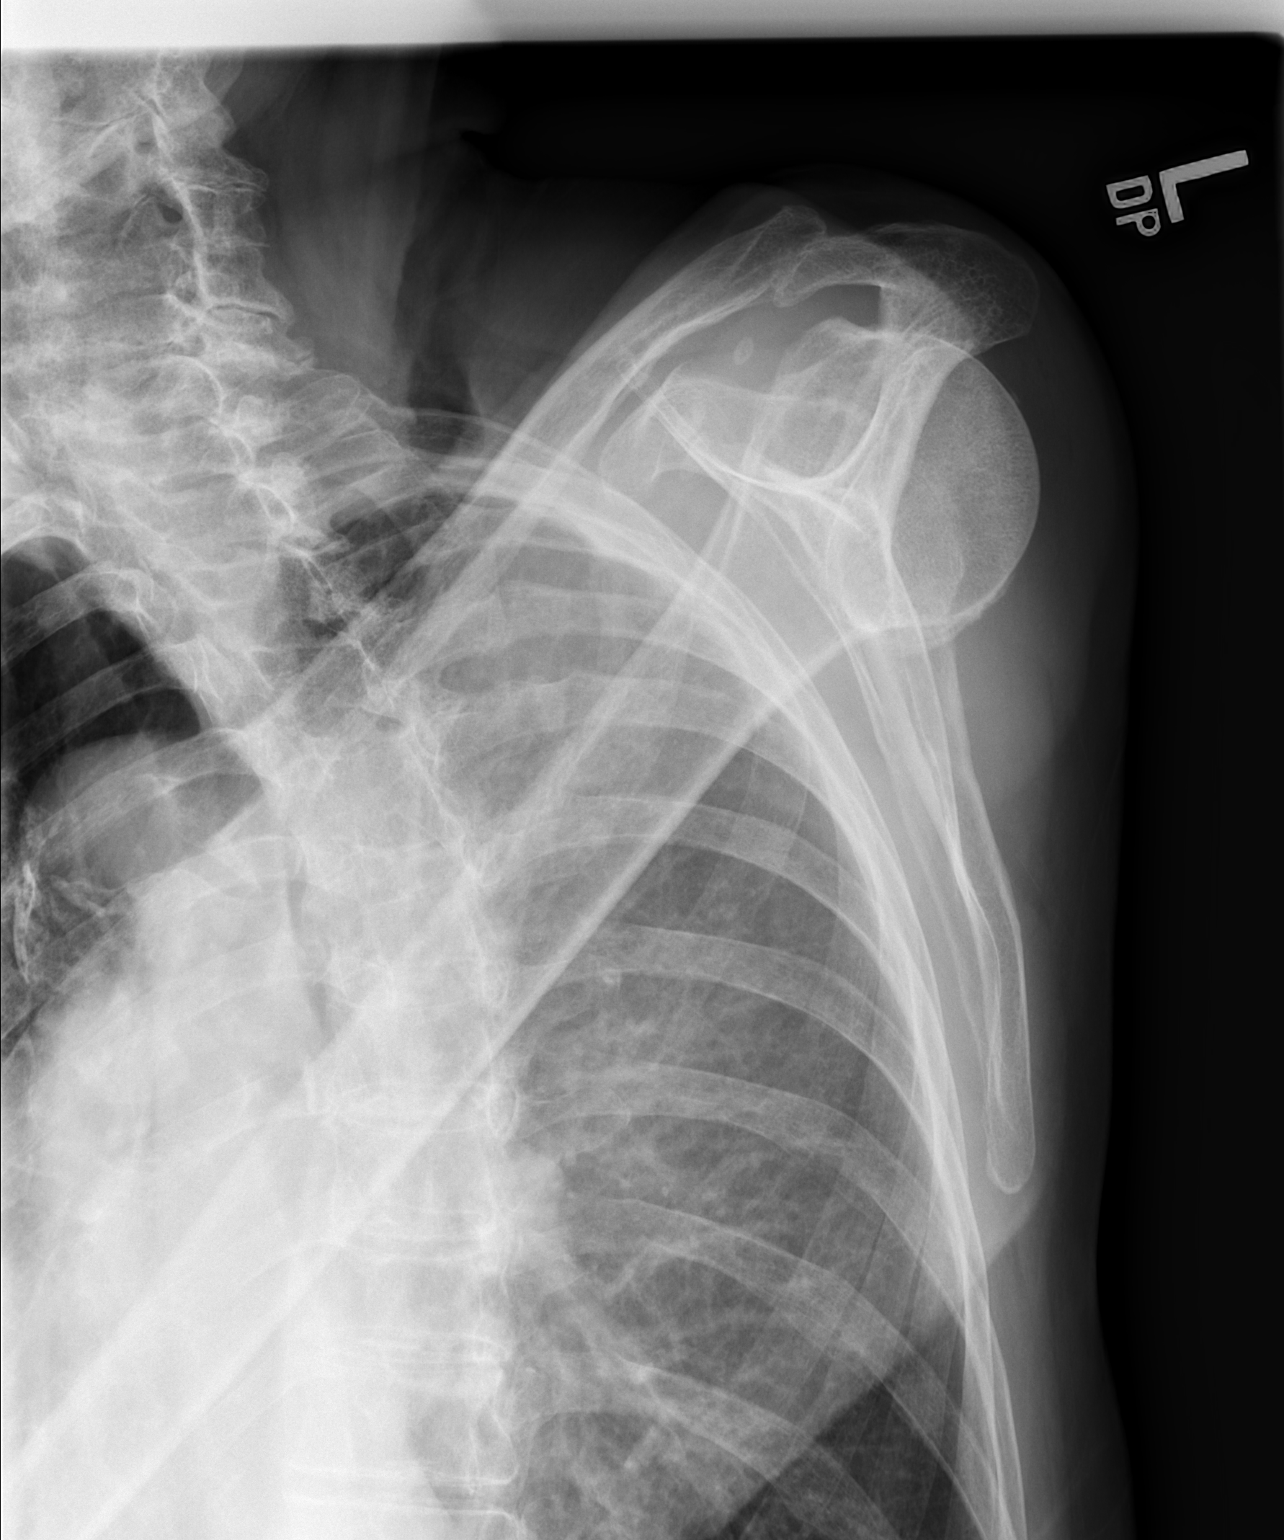

[2 of 2 positions shown; findings below may reference images not displayed]

FINDINGS: No fracture or dislocation. Mild osteoarthritis of the glenohumeral
joint. Possible loose body or dystrophic calcification along the
anterior aspect of the shoulder. Mild degenerative changes of the
acromioclavicular joint. Soft tissues are normal.
IMPRESSION: 1. No acute osseous injury of the left shoulder.
2. Mild osteoarthritis of the left shoulder.

## 2023-10-05 ENCOUNTER — Ambulatory Visit: Payer: Medicare Other | Admitting: Podiatry

## 2023-10-19 DIAGNOSIS — G4700 Insomnia, unspecified: Secondary | ICD-10-CM | POA: Diagnosis not present

## 2023-10-19 DIAGNOSIS — I10 Essential (primary) hypertension: Secondary | ICD-10-CM | POA: Diagnosis not present

## 2023-10-19 DIAGNOSIS — J302 Other seasonal allergic rhinitis: Secondary | ICD-10-CM | POA: Diagnosis not present

## 2023-10-26 ENCOUNTER — Encounter: Payer: Self-pay | Admitting: Podiatry

## 2023-10-26 ENCOUNTER — Ambulatory Visit (INDEPENDENT_AMBULATORY_CARE_PROVIDER_SITE_OTHER): Admitting: Podiatry

## 2023-10-26 DIAGNOSIS — B351 Tinea unguium: Secondary | ICD-10-CM

## 2023-10-26 DIAGNOSIS — I739 Peripheral vascular disease, unspecified: Secondary | ICD-10-CM | POA: Diagnosis not present

## 2023-10-26 DIAGNOSIS — Q828 Other specified congenital malformations of skin: Secondary | ICD-10-CM | POA: Diagnosis not present

## 2023-10-26 DIAGNOSIS — D689 Coagulation defect, unspecified: Secondary | ICD-10-CM

## 2023-10-26 DIAGNOSIS — M79675 Pain in left toe(s): Secondary | ICD-10-CM | POA: Diagnosis not present

## 2023-10-26 DIAGNOSIS — M79674 Pain in right toe(s): Secondary | ICD-10-CM

## 2023-10-30 NOTE — Progress Notes (Signed)
  Subjective:  Patient ID: Jill Harvey, female    DOB: [birth year],  MRN: [id number]  Jill Harvey presents to clinic today for at risk foot care with h/o coagulation defect and painful porokeratotic lesion(s) left lower extremity and painful mycotic toenails that limit ambulation. Painful toenails interfere with ambulation. Aggravating factors include wearing enclosed shoe gear. Pain is relieved with periodic professional debridement. Painful porokeratotic lesions are aggravated when weightbearing with and without shoegear. Pain is relieved with periodic professional debridement. She is accompanied by her daughter on today's visit. Chief Complaint  Patient presents with   Nail Problem    RFC   New problem(s): None.   PCP is Tita Form, MD.   Allergies  Allergen Reactions   Amoxicillin    Macrodantin [Nitrofurantoin Macrocrystal]     Review of Systems: Negative except as noted in the HPI.  Objective: No changes noted in today's physical examination. There were no vitals filed for this visit. Jill Harvey is a pleasant 89 y.o. female WD, WN in NAD. AAO x 3.  Vascular Examination: Vascular status intact b/l with palpable pedal pulses. CFT immediate b/l. Pedal hair present. No edema. No pain with calf compression b/l. Skin temperature gradient WNL b/l. No varicosities noted. No cyanosis or clubbing noted.  Neurological Examination: Sensation grossly intact b/l with 10 gram monofilament. Vibratory sensation intact b/l.  Dermatological Examination: Pedal skin with normal turgor, texture and tone b/l. No open wounds nor interdigital macerations noted. Toenails 1-5 b/l thick, discolored, elongated with subungual debris and pain on dorsal palpation. Porokeratotic lesion(s) submet head 1st metatarsal head left lower extremity. No erythema, no edema, no drainage, no fluctuance.   Musculoskeletal Examination: Muscle strength 5/5 to b/l LE.  No pain, crepitus noted b/l.  Hammertoe deformity noted 2-5 b/l.  Radiographs: None  Assessment/Plan: 1. Pain due to onychomycosis of toenails of both feet   2. Porokeratosis   3. Coagulation defect (HCC)   4. PVD (peripheral vascular disease) (HCC)   Patient was evaluated and treated. All patient's and/or POA's questions/concerns addressed on today's visit. Toenails 1-5 debrided in length and girth without incident. Porokeratotic lesion(s) submet head 1 left foot pared with sharp debridement without incident. Continue soft, supportive shoe gear daily. Report any pedal injuries to medical professional. Call office if there are any questions/concerns. -Patient/POA to call should there be question/concern in the interim.   Return in about 9 weeks (around 12/28/2023).  Jill Harvey, DPM      [place] LOCATION: [address], [place] [address]                   Office [phone]   [facility] LOCATION: [address] [place], [place] [address] Office [phone]

## 2023-11-16 DIAGNOSIS — G4700 Insomnia, unspecified: Secondary | ICD-10-CM | POA: Diagnosis not present

## 2023-11-16 DIAGNOSIS — Q249 Congenital malformation of heart, unspecified: Secondary | ICD-10-CM | POA: Diagnosis not present

## 2023-11-16 DIAGNOSIS — I10 Essential (primary) hypertension: Secondary | ICD-10-CM | POA: Diagnosis not present

## 2023-12-28 ENCOUNTER — Ambulatory Visit (INDEPENDENT_AMBULATORY_CARE_PROVIDER_SITE_OTHER): Admitting: Podiatry

## 2023-12-28 ENCOUNTER — Encounter: Payer: Self-pay | Admitting: Podiatry

## 2023-12-28 DIAGNOSIS — M79675 Pain in left toe(s): Secondary | ICD-10-CM

## 2023-12-28 DIAGNOSIS — M79674 Pain in right toe(s): Secondary | ICD-10-CM | POA: Diagnosis not present

## 2023-12-28 DIAGNOSIS — D689 Coagulation defect, unspecified: Secondary | ICD-10-CM

## 2023-12-28 DIAGNOSIS — B351 Tinea unguium: Secondary | ICD-10-CM

## 2023-12-28 DIAGNOSIS — L84 Corns and callosities: Secondary | ICD-10-CM | POA: Diagnosis not present

## 2024-01-02 NOTE — Progress Notes (Signed)
  Subjective:  Patient ID: Jill Harvey  Jill Harvey, female    DOB: Nov 13, 1925,  MRN: [id number]  Terriyah  B Tischer presents to clinic today for at risk foot care with h/o coagulation defect and painful porokeratotic lesion(s) left foot and painful mycotic toenails that limit ambulation. Painful toenails interfere with ambulation. Aggravating factors include wearing enclosed shoe gear. Pain is relieved with periodic professional debridement. Painful porokeratotic lesions are aggravated when weightbearing with and without shoegear. Pain is relieved with periodic professional debridement. She is accompanied by her daughter on today's visit. Chief Complaint  Patient presents with   RFC    Rm17 Not diabetic/ Dr. Charlott   New problem(s): None.   PCP is Caleen Dirks, MD.  Allergies  Allergen Reactions   Amoxicillin    Macrodantin [Nitrofurantoin Macrocrystal]     Review of Systems: Negative except as noted in the HPI.  Objective: No changes noted in today's physical examination. There were no vitals filed for this visit. Jill  B Harvey is a pleasant 89 y.o. female WD, WN in NAD. AAO x 3.  Vascular Examination: Capillary refill time immediate b/l. Vascular status intact b/l with palpable pedal pulses. Pedal hair present b/l. No pain with calf compression b/l. Skin temperature gradient WNL b/l. No cyanosis or clubbing b/l. No ischemia or gangrene noted b/l.   Neurological Examination: Sensation grossly intact b/l with 10 gram monofilament. Vibratory sensation intact b/l.   Dermatological Examination: Pedal skin with normal turgor, texture and tone b/l.  No open wounds. No interdigital macerations.   Toenails 1-5 b/l thick, discolored, elongated with subungual debris and pain on dorsal palpation.   Porokeratotic lesion(s) submet head 1 left foot. No erythema, no edema, no drainage, no fluctuance.  Musculoskeletal Examination: Muscle strength 5/5 to all lower extremity muscle groups  bilaterally. Hammertoe(s) 2-5 b/l.  Radiographs: None  Assessment/Plan: 1. Pain due to onychomycosis of toenails of both feet   2. Callus of foot   3. Coagulation defect [facility])     Patient was evaluated and treated. All patient's and/or POA's questions/concerns addressed on today's visit. Toenails 1-5 debrided in length and girth without incident. Porokeratotic lesion(s) submet head 1 left foot pared with sharp debridement without incident. Continue soft, supportive shoe gear daily. Report any pedal injuries to medical professional. Call office if there are any questions/concerns. -Patient/POA to call should there be question/concern in the interim.   Return in about 3 months (around 03/29/2024).  Delon LITTIE Merlin, DPM      [place] LOCATION: [address] [place] [address]                   Office [phone]   [facility] LOCATION: [address] [place], [place] [address] Office [phone]

## 2024-02-03 DIAGNOSIS — I4891 Unspecified atrial fibrillation: Secondary | ICD-10-CM | POA: Diagnosis not present

## 2024-02-03 DIAGNOSIS — G4700 Insomnia, unspecified: Secondary | ICD-10-CM | POA: Diagnosis not present

## 2024-02-03 DIAGNOSIS — I10 Essential (primary) hypertension: Secondary | ICD-10-CM | POA: Diagnosis not present

## 2024-02-28 ENCOUNTER — Ambulatory Visit: Admitting: Podiatry

## 2024-03-19 DIAGNOSIS — I4891 Unspecified atrial fibrillation: Secondary | ICD-10-CM | POA: Diagnosis not present

## 2024-03-19 DIAGNOSIS — I10 Essential (primary) hypertension: Secondary | ICD-10-CM | POA: Diagnosis not present

## 2024-03-19 DIAGNOSIS — G4700 Insomnia, unspecified: Secondary | ICD-10-CM | POA: Diagnosis not present

## 2024-05-02 ENCOUNTER — Encounter: Payer: Self-pay | Admitting: Podiatry

## 2024-05-02 ENCOUNTER — Ambulatory Visit: Admitting: Podiatry

## 2024-05-02 DIAGNOSIS — L84 Corns and callosities: Secondary | ICD-10-CM

## 2024-05-02 DIAGNOSIS — D689 Coagulation defect, unspecified: Secondary | ICD-10-CM

## 2024-05-02 DIAGNOSIS — B351 Tinea unguium: Secondary | ICD-10-CM

## 2024-05-02 DIAGNOSIS — M79675 Pain in left toe(s): Secondary | ICD-10-CM | POA: Diagnosis not present

## 2024-05-02 DIAGNOSIS — M79674 Pain in right toe(s): Secondary | ICD-10-CM | POA: Diagnosis not present

## 2024-05-02 DIAGNOSIS — I739 Peripheral vascular disease, unspecified: Secondary | ICD-10-CM | POA: Diagnosis not present

## 2024-05-02 DIAGNOSIS — R410 Disorientation, unspecified: Secondary | ICD-10-CM | POA: Diagnosis not present

## 2024-05-11 NOTE — Progress Notes (Signed)
  Subjective:  Patient ID: Jill  MIZANI Harvey, female    DOB: [birth year],  MRN: [id number]  Jill  B Harvey presents to clinic today for at risk foot care. Patient has h/o PAD and painful porokeratotic lesion(s) left lower extremity and painful mycotic toenails that limit ambulation. Painful toenails interfere with ambulation. Aggravating factors include wearing enclosed shoe gear. Pain is relieved with periodic professional debridement. Painful porokeratotic lesions are aggravated when weightbearing with and without shoegear. Pain is relieved with periodic professional debridement.   New problem(s): None.   PCP is Caleen Dirks, MD.  Allergies  Allergen Reactions   Amoxicillin    Macrodantin [Nitrofurantoin Macrocrystal]     Review of Systems: Negative except as noted in the HPI.  Objective: No changes noted in today's physical examination. There were no vitals filed for this visit. Jill  B Harvey is a pleasant 89 y.o. female WD, WN in NAD. AAO x 3.  Vascular Examination: Capillary refill time immediate b/l. Vascular status intact b/l with palpable pedal pulses. Pedal hair present b/l. No pain with calf compression b/l. Skin temperature gradient WNL b/l. No cyanosis or clubbing b/l. No ischemia or gangrene noted b/l.   Neurological Examination: Sensation grossly intact b/l with 10 gram monofilament. Vibratory sensation intact b/l.   Dermatological Examination: Pedal skin with normal turgor, texture and tone b/l.  No open wounds. No interdigital macerations.   Toenails 1-5 b/l thick, discolored, elongated with subungual debris and pain on dorsal palpation.   Porokeratotic lesion(s) submet head 1 left foot. No erythema, no edema, no drainage, no fluctuance.  Musculoskeletal Examination: Muscle strength 5/5 to all lower extremity muscle groups bilaterally. Hammertoe(s) 2-5 b/l.  Radiographs: None  Assessment/Plan: 1. Pain due to onychomycosis of toenails of both feet   2.  Callus of foot   3. Coagulation defect   4. PVD (peripheral vascular disease)   Consent given for treatment. Patient examined. All patient's and/or POA's questions/concerns addressed on today's visit. Mycotic toenails 1-5 b/l debrided in length and girth without incident. Porokeratotic lesion(s) submet head 1 left foot pared and enucleated with sharp debridement without incident.Continue soft, supportive shoe gear daily. Report any pedal injuries to medical professional. Call office if there are any quesitons/concerns. Return in about 3 months (around 08/02/2024).  Delon LITTIE Merlin, DPM      [place] LOCATION: [address], [place] [address]                   Office [phone]   [facility] LOCATION: [address] [place], [place] [address] Office [phone]

## 2024-05-16 DIAGNOSIS — G479 Sleep disorder, unspecified: Secondary | ICD-10-CM | POA: Diagnosis not present

## 2024-05-16 DIAGNOSIS — I4891 Unspecified atrial fibrillation: Secondary | ICD-10-CM | POA: Diagnosis not present

## 2024-07-05 ENCOUNTER — Encounter: Payer: Self-pay | Admitting: Podiatry

## 2024-07-05 ENCOUNTER — Ambulatory Visit: Admitting: Podiatry

## 2024-07-05 DIAGNOSIS — L84 Corns and callosities: Secondary | ICD-10-CM | POA: Diagnosis not present

## 2024-07-05 DIAGNOSIS — M79675 Pain in left toe(s): Secondary | ICD-10-CM | POA: Diagnosis not present

## 2024-07-05 DIAGNOSIS — M79674 Pain in right toe(s): Secondary | ICD-10-CM | POA: Diagnosis not present

## 2024-07-05 DIAGNOSIS — B351 Tinea unguium: Secondary | ICD-10-CM | POA: Diagnosis not present

## 2024-07-05 DIAGNOSIS — D689 Coagulation defect, unspecified: Secondary | ICD-10-CM

## 2024-07-05 DIAGNOSIS — I739 Peripheral vascular disease, unspecified: Secondary | ICD-10-CM

## 2024-07-05 NOTE — Progress Notes (Addendum)
 This patient returns to my office for at risk foot care.  This patient requires this care by a professional since this patient will be at risk due to having chronic anticoagulation. This patient is unable to cut nails herself since the patient cannot reach her nails.These nails are painful walking and wearing shoes.  This patient presents for at risk foot care today.  General Appearance  Alert, conversant and in no acute stress.  Vascular  Dorsalis pedis and posterior tibial  pulses are  weakly palpable  bilaterally.  Capillary return is within normal limits  bilaterally. Temperature is within normal limits  bilaterally.  Neurologic  Senn-Weinstein monofilament wire test within normal limits  bilaterally. Muscle power within normal limits bilaterally.  Nails Thick disfigured discolored nails with subungual debris  from hallux to fifth toes bilaterally. No evidence of bacterial infection or drainage bilaterally.  Orthopedic  No limitations of motion  feet .  No crepitus or effusions noted.  No bony pathology or digital deformities noted.  Skin  normotropic skin with no porokeratosis noted bilaterally.  No signs of infections or ulcers noted.     Onychomycosis  Pain in right toes  Pain in left toes  Consent was obtained for treatment procedures.   Mechanical debridement of nails 1-5  bilaterally performed with a nail nipper.  Filed with dremel without incident.    Return office visit   10 weeks                 Told patient to return for periodic foot care and evaluation due to potential at risk complications.   Cordella Bold DPM

## 2024-09-11 ENCOUNTER — Ambulatory Visit: Admitting: Podiatry

## 2024-11-13 ENCOUNTER — Ambulatory Visit: Admitting: Podiatry
